# Patient Record
Sex: Female | Born: 1985 | Race: White | Hispanic: No | Smoking: Current every day smoker
Health system: Southern US, Community
[De-identification: ages and names within clinical notes are randomized; demographics above are authoritative.]

## PROBLEM LIST (undated history)

## (undated) ENCOUNTER — Inpatient Hospital Stay: Payer: Self-pay

## (undated) DIAGNOSIS — R569 Unspecified convulsions: Secondary | ICD-10-CM

---
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
## 2006-04-05 ENCOUNTER — Emergency Department: Payer: Self-pay | Admitting: Emergency Medicine

## 2006-05-08 ENCOUNTER — Emergency Department: Payer: Self-pay | Admitting: Emergency Medicine

## 2006-05-10 ENCOUNTER — Ambulatory Visit: Payer: Self-pay | Admitting: Obstetrics & Gynecology

## 2006-05-19 ENCOUNTER — Emergency Department: Payer: Self-pay | Admitting: Emergency Medicine

## 2006-08-12 ENCOUNTER — Emergency Department: Payer: Self-pay | Admitting: Internal Medicine

## 2006-08-28 ENCOUNTER — Observation Stay: Payer: Self-pay

## 2007-01-07 ENCOUNTER — Observation Stay: Payer: Self-pay | Admitting: Obstetrics and Gynecology

## 2007-02-26 ENCOUNTER — Emergency Department: Payer: Self-pay | Admitting: Emergency Medicine

## 2007-05-12 ENCOUNTER — Emergency Department: Payer: Self-pay | Admitting: Emergency Medicine

## 2007-07-22 ENCOUNTER — Other Ambulatory Visit: Payer: Self-pay

## 2007-07-22 ENCOUNTER — Emergency Department: Payer: Self-pay | Admitting: Emergency Medicine

## 2007-08-13 ENCOUNTER — Emergency Department: Payer: Self-pay | Admitting: Emergency Medicine

## 2008-02-22 ENCOUNTER — Emergency Department: Payer: Self-pay | Admitting: Emergency Medicine

## 2008-06-06 ENCOUNTER — Emergency Department: Payer: Self-pay | Admitting: Emergency Medicine

## 2011-10-13 ENCOUNTER — Emergency Department: Payer: Self-pay | Admitting: Emergency Medicine

## 2011-10-26 ENCOUNTER — Emergency Department: Payer: Self-pay | Admitting: Emergency Medicine

## 2012-08-30 ENCOUNTER — Encounter (HOSPITAL_COMMUNITY): Payer: Self-pay | Admitting: *Deleted

## 2012-08-30 ENCOUNTER — Emergency Department (HOSPITAL_COMMUNITY)
Admission: EM | Admit: 2012-08-30 | Discharge: 2012-08-30 | Disposition: A | Discharge status: Home / Self Care | Payer: Medicaid Other | Attending: Emergency Medicine | Admitting: Emergency Medicine

## 2012-08-30 DIAGNOSIS — Z331 Pregnant state, incidental: Secondary | ICD-10-CM | POA: Insufficient documentation

## 2012-08-30 DIAGNOSIS — F17200 Nicotine dependence, unspecified, uncomplicated: Secondary | ICD-10-CM | POA: Insufficient documentation

## 2012-08-30 DIAGNOSIS — R112 Nausea with vomiting, unspecified: Secondary | ICD-10-CM | POA: Insufficient documentation

## 2012-08-30 DIAGNOSIS — Z8669 Personal history of other diseases of the nervous system and sense organs: Secondary | ICD-10-CM | POA: Insufficient documentation

## 2012-08-30 DIAGNOSIS — N949 Unspecified condition associated with female genital organs and menstrual cycle: Secondary | ICD-10-CM | POA: Insufficient documentation

## 2012-08-30 DIAGNOSIS — Z3490 Encounter for supervision of normal pregnancy, unspecified, unspecified trimester: Secondary | ICD-10-CM

## 2012-08-30 HISTORY — DX: Unspecified convulsions: R56.9

## 2012-08-30 LAB — CBC WITH DIFFERENTIAL/PLATELET
Eosinophils Relative: 1 % (ref 0–5)
HCT: 38.7 % (ref 36.0–46.0)
Hemoglobin: 13.3 g/dL (ref 12.0–15.0)
Lymphocytes Relative: 21 % (ref 12–46)
Lymphs Abs: 3.1 10*3/uL (ref 0.7–4.0)
MCV: 87.4 fL (ref 78.0–100.0)
Monocytes Absolute: 0.6 10*3/uL (ref 0.1–1.0)
Monocytes Relative: 4 % (ref 3–12)
Neutro Abs: 10.7 10*3/uL — ABNORMAL HIGH (ref 1.7–7.7)
RDW: 13.3 % (ref 11.5–15.5)
WBC: 14.5 10*3/uL — ABNORMAL HIGH (ref 4.0–10.5)

## 2012-08-30 LAB — WET PREP, GENITAL
Trich, Wet Prep: NONE SEEN
Yeast Wet Prep HPF POC: NONE SEEN

## 2012-08-30 LAB — URINALYSIS, ROUTINE W REFLEX MICROSCOPIC
Bilirubin Urine: NEGATIVE
Glucose, UA: NEGATIVE mg/dL
Hgb urine dipstick: NEGATIVE
Specific Gravity, Urine: 1.01 (ref 1.005–1.030)
Urobilinogen, UA: 0.2 mg/dL (ref 0.0–1.0)

## 2012-08-30 LAB — PREGNANCY, URINE: Preg Test, Ur: POSITIVE — AB

## 2012-08-30 NOTE — ED Provider Notes (Signed)
History     CSN: [id number]  Arrival date & time 08/30/12  1610   First MD Initiated Contact with Patient 08/30/12 1749      Chief Complaint  Patient presents with  . Abdominal Pain    (Consider location/radiation/quality/duration/timing/severity/associated sxs/prior treatment) HPI Comments: Patient presents with suprapubic discomfort that started this morning.  She reports nausea and vomiting yesterday.  She had a light period last month and is late this month.  Two home pregnancy tests positive at home.  She is sexually active with one partner not using protection.    Patient is a 27 y.o. female presenting with abdominal pain. The history is provided by the patient.  Abdominal Pain Pain location:  Suprapubic Pain quality: cramping   Pain radiates to:  Does not radiate Pain severity:  Mild Onset quality:  Sudden Duration: started this morning. Timing:  Intermittent Progression:  Worsening Chronicity:  New Relieved by:  Nothing Worsened by:  Nothing tried Ineffective treatments:  None tried   Past Medical History  Diagnosis Date  . Seizures     Past Surgical History  Procedure Laterality Date  . Cesarean section      History reviewed. No pertinent family history.  History  Substance Use Topics  . Smoking status: Current Every Day Smoker  . Smokeless tobacco: Not on file  . Alcohol Use: Yes    OB History   Grav Para Term Preterm Abortions TAB SAB Ect Mult Living                  Review of Systems  Gastrointestinal: Positive for abdominal pain.  All other systems reviewed and are negative.    Allergies  Review of patient's allergies indicates no known allergies.  Home Medications   Current Outpatient Rx  Name  Route  Sig  Dispense  Refill  . Multiple Vitamins-Minerals (HAIR/SKIN/NAILS PO)   Oral   Take 1 tablet by mouth daily.         Marland Kitchen oxyCODONE-acetaminophen (PERCOCET/ROXICET) 5-325 MG per tablet   Oral   Take 1 tablet by mouth every 4  (four) hours as needed for pain.           BP 141/91  Pulse 114  Temp(Src) 98.3 F (36.8 C) (Oral)  Resp 20  Ht 5\' 3"  (1.6 m)  Wt 160 lb (72.576 kg)  BMI 28.35 kg/m2  SpO2 99%  LMP 07/10/2012  Physical Exam  Nursing note and vitals reviewed. Constitutional: She is oriented to person, place, and time. She appears well-developed and well-nourished. No distress.  HENT:  Head: Normocephalic and atraumatic.  Neck: Normal range of motion. Neck supple.  Cardiovascular: Normal rate and regular rhythm.  Exam reveals no gallop and no friction rub.   No murmur heard. Pulmonary/Chest: Effort normal and breath sounds normal. No respiratory distress. She has no wheezes.  Abdominal: Soft. Bowel sounds are normal. She exhibits no distension. There is no tenderness.  Musculoskeletal: Normal range of motion.  Neurological: She is alert and oriented to person, place, and time.  Skin: Skin is warm and dry. She is not diaphoretic.    ED Course  Procedures (including critical care time)  Labs Reviewed  URINALYSIS, ROUTINE W REFLEX MICROSCOPIC  PREGNANCY, URINE   No results found.   No diagnosis found.    MDM  The patient presents here with lower abdominal cramping, vomiting since yesterday.  She had two positive pregnancy tests at home.  Her urine preg is positive and this was  followed up with a quantitative that was 38K.  She has not had any bleeding and the pelvic exam is essentially unremarkable.  The wet prep is not diagnostic of yeast infection or BV.  I doubt this is an ectopic pregnancy, however I feel as though an ultrasound in the next 12-24 hours would be appropriate as she appears quite stable hemodynamically.  The Hb is 13.3 and she appears in no discomfort.          Geoffery Lyons, MD 08/30/12 2025

## 2012-08-30 NOTE — ED Notes (Signed)
abd pain , onset this am.  Vomiting yesterday.  Pos home preg test last pm.

## 2012-08-31 ENCOUNTER — Other Ambulatory Visit (HOSPITAL_COMMUNITY): Payer: Medicaid Other

## 2012-09-01 LAB — GC/CHLAMYDIA PROBE AMP
CT Probe RNA: NEGATIVE
GC Probe RNA: NEGATIVE

## 2012-09-03 ENCOUNTER — Other Ambulatory Visit: Payer: Self-pay | Admitting: Neurology

## 2012-09-03 ENCOUNTER — Other Ambulatory Visit (HOSPITAL_COMMUNITY): Payer: Self-pay | Admitting: Emergency Medicine

## 2012-09-03 ENCOUNTER — Ambulatory Visit (HOSPITAL_COMMUNITY)
Admission: RE | Admit: 2012-09-03 | Discharge: 2012-09-03 | Disposition: A | Discharge status: Home / Self Care | Payer: Medicaid Other | Source: Ambulatory Visit | Attending: Emergency Medicine | Admitting: Emergency Medicine

## 2012-09-03 DIAGNOSIS — Z36 Encounter for antenatal screening of mother: Secondary | ICD-10-CM | POA: Insufficient documentation

## 2012-09-15 ENCOUNTER — Other Ambulatory Visit: Payer: Self-pay | Admitting: Obstetrics & Gynecology

## 2012-09-15 DIAGNOSIS — O3680X0 Pregnancy with inconclusive fetal viability, not applicable or unspecified: Secondary | ICD-10-CM

## 2012-09-17 ENCOUNTER — Other Ambulatory Visit: Payer: Self-pay | Admitting: Obstetrics & Gynecology

## 2012-09-17 ENCOUNTER — Ambulatory Visit (INDEPENDENT_AMBULATORY_CARE_PROVIDER_SITE_OTHER): Payer: Medicaid Other

## 2012-09-17 DIAGNOSIS — O26849 Uterine size-date discrepancy, unspecified trimester: Secondary | ICD-10-CM

## 2012-09-17 DIAGNOSIS — O3680X0 Pregnancy with inconclusive fetal viability, not applicable or unspecified: Secondary | ICD-10-CM

## 2012-09-17 NOTE — Progress Notes (Signed)
U/S-single IUP +FCA noted, FHR=164 BPM, cx long and closed, bilateral adnexa wnl, C.L. Noted on Lt (1.8cm), CRl c/w 9+1wks EDD 04/21/2013

## 2012-09-19 LAB — US OB COMP LESS 14 WKS

## 2012-09-22 ENCOUNTER — Encounter: Payer: Medicaid Other | Admitting: Advanced Practice Midwife

## 2012-10-11 ENCOUNTER — Encounter: Payer: Medicaid Other | Admitting: Women's Health

## 2012-10-11 ENCOUNTER — Encounter: Payer: Self-pay | Admitting: *Deleted

## 2012-10-14 ENCOUNTER — Encounter: Payer: Medicaid Other | Admitting: Advanced Practice Midwife

## 2012-10-25 ENCOUNTER — Other Ambulatory Visit: Payer: Self-pay | Admitting: Women's Health

## 2012-10-25 ENCOUNTER — Ambulatory Visit (INDEPENDENT_AMBULATORY_CARE_PROVIDER_SITE_OTHER): Payer: Medicaid Other | Admitting: Women's Health

## 2012-10-25 ENCOUNTER — Other Ambulatory Visit (HOSPITAL_COMMUNITY)
Admission: RE | Admit: 2012-10-25 | Discharge: 2012-10-25 | Disposition: A | Discharge status: Home / Self Care | Payer: Medicaid Other | Source: Ambulatory Visit | Attending: Obstetrics & Gynecology | Admitting: Obstetrics & Gynecology

## 2012-10-25 ENCOUNTER — Encounter: Payer: Self-pay | Admitting: Women's Health

## 2012-10-25 VITALS — BP 140/90 | Wt 179.8 lb

## 2012-10-25 DIAGNOSIS — Z113 Encounter for screening for infections with a predominantly sexual mode of transmission: Secondary | ICD-10-CM | POA: Insufficient documentation

## 2012-10-25 DIAGNOSIS — Z3482 Encounter for supervision of other normal pregnancy, second trimester: Secondary | ICD-10-CM

## 2012-10-25 DIAGNOSIS — O34219 Maternal care for unspecified type scar from previous cesarean delivery: Secondary | ICD-10-CM

## 2012-10-25 DIAGNOSIS — O2342 Unspecified infection of urinary tract in pregnancy, second trimester: Secondary | ICD-10-CM

## 2012-10-25 DIAGNOSIS — Z3480 Encounter for supervision of other normal pregnancy, unspecified trimester: Secondary | ICD-10-CM | POA: Insufficient documentation

## 2012-10-25 DIAGNOSIS — Z1389 Encounter for screening for other disorder: Secondary | ICD-10-CM

## 2012-10-25 DIAGNOSIS — Z331 Pregnant state, incidental: Secondary | ICD-10-CM

## 2012-10-25 DIAGNOSIS — O2390 Unspecified genitourinary tract infection in pregnancy, unspecified trimester: Secondary | ICD-10-CM

## 2012-10-25 DIAGNOSIS — Z3481 Encounter for supervision of other normal pregnancy, first trimester: Secondary | ICD-10-CM

## 2012-10-25 DIAGNOSIS — Z01419 Encounter for gynecological examination (general) (routine) without abnormal findings: Secondary | ICD-10-CM | POA: Insufficient documentation

## 2012-10-25 DIAGNOSIS — Z98891 History of uterine scar from previous surgery: Secondary | ICD-10-CM | POA: Insufficient documentation

## 2012-10-25 LAB — POCT URINALYSIS DIPSTICK
Ketones, UA: NEGATIVE
Leukocytes, UA: NEGATIVE
Protein, UA: NEGATIVE

## 2012-10-25 MED ORDER — PRENATAL PLUS 27-1 MG PO TABS: 1.0000 | ORAL_TABLET | Freq: Every day | ORAL | Status: DC

## 2012-10-25 MED ORDER — NITROFURANTOIN MONOHYD MACRO 100 MG PO CAPS: 100.0000 mg | ORAL_CAPSULE | Freq: Two times a day (BID) | ORAL | Status: DC

## 2012-10-25 NOTE — Patient Instructions (Addendum)
You will have your sugar test next visit.  Please do not eat or drink anything after midnight the night before you come, not even water.  You will be here for at least two hours.     Pregnancy - Second Trimester The second trimester of pregnancy (3 to 6 months) is a period of rapid growth for you and your baby. At the end of the sixth month, your baby is about 9 inches long and weighs 1 1/2 pounds. You will begin to feel the baby move between 18 and 20 weeks of the pregnancy. This is called quickening. Weight gain is faster. A clear fluid (colostrum) may leak out of your breasts. You may feel small contractions of the womb (uterus). This is known as false labor or Braxton-Hicks contractions. This is like a practice for labor when the baby is ready to be born. Usually, the problems with morning sickness have usually passed by the end of your first trimester. Some women develop small dark blotches (called cholasma, mask of pregnancy) on their face that usually goes away after the baby is born. Exposure to the sun makes the blotches worse. Acne may also develop in some pregnant women and pregnant women who have acne, may find that it goes away. PRENATAL EXAMS  Blood work may continue to be done during prenatal exams. These tests are done to check on your health and the probable health of your baby. Blood work is used to follow your blood levels (hemoglobin). Anemia (low hemoglobin) is common during pregnancy. Iron and vitamins are given to help prevent this. You will also be checked for diabetes between 24 and 28 weeks of the pregnancy. Some of the previous blood tests may be repeated.  The size of the uterus is measured during each visit. This is to make sure that the baby is continuing to grow properly according to the dates of the pregnancy.  Your blood pressure is checked every prenatal visit. This is to make sure you are not getting toxemia.  Your urine is checked to make sure you do not have an  infection, diabetes or protein in the urine.  Your weight is checked often to make sure gains are happening at the suggested rate. This is to ensure that both you and your baby are growing normally.  Sometimes, an ultrasound is performed to confirm the proper growth and development of the baby. This is a test which bounces harmless sound waves off the baby so your caregiver can more accurately determine due dates. Sometimes, a specialized test is done on the amniotic fluid surrounding the baby. This test is called an amniocentesis. The amniotic fluid is obtained by sticking a needle into the belly (abdomen). This is done to check the chromosomes in instances where there is a concern about possible genetic problems with the baby. It is also sometimes done near the end of pregnancy if an early delivery is required. In this case, it is done to help make sure the baby's lungs are mature enough for the baby to live outside of the womb. CHANGES OCCURING IN THE SECOND TRIMESTER OF PREGNANCY Your body goes through many changes during pregnancy. They vary from person to person. Talk to your caregiver about changes you notice that you are concerned about.  During the second trimester, you will likely have an increase in your appetite. It is normal to have cravings for certain foods. This varies from person to person and pregnancy to pregnancy.  Your lower abdomen will  begin to bulge.  You may have to urinate more often because the uterus and baby are pressing on your bladder. It is also common to get more bladder infections during pregnancy (pain with urination). You can help this by drinking lots of fluids and emptying your bladder before and after intercourse.  You may begin to get stretch marks on your hips, abdomen, and breasts. These are normal changes in the body during pregnancy. There are no exercises or medications to take that prevent this change.  You may begin to develop swollen and bulging veins  (varicose veins) in your legs. Wearing support hose, elevating your feet for 15 minutes, 3 to 4 times a day and limiting salt in your diet helps lessen the problem.  Heartburn may develop as the uterus grows and pushes up against the stomach. Antacids recommended by your caregiver helps with this problem. Also, eating smaller meals 4 to 5 times a day helps.  Constipation can be treated with a stool softener or adding bulk to your diet. Drinking lots of fluids, vegetables, fruits, and whole grains are helpful.  Exercising is also helpful. If you have been very active up until your pregnancy, most of these activities can be continued during your pregnancy. If you have been less active, it is helpful to start an exercise program such as walking.  Hemorrhoids (varicose veins in the rectum) may develop at the end of the second trimester. Warm sitz baths and hemorrhoid cream recommended by your caregiver helps hemorrhoid problems.  Backaches may develop during this time of your pregnancy. Avoid heavy lifting, wear low heal shoes and practice good posture to help with backache problems.  Some pregnant women develop tingling and numbness of their hand and fingers because of swelling and tightening of ligaments in the wrist (carpel tunnel syndrome). This goes away after the baby is born.  As your breasts enlarge, you may have to get a bigger bra. Get a comfortable, cotton, support bra. Do not get a nursing bra until the last month of the pregnancy if you will be nursing the baby.  You may get a dark line from your belly button to the pubic area called the linea nigra.  You may develop rosy cheeks because of increase blood flow to the face.  You may develop spider looking lines of the face, neck, arms and chest. These go away after the baby is born. HOME CARE INSTRUCTIONS   It is extremely important to avoid all smoking, herbs, alcohol, and unprescribed drugs during your pregnancy. These chemicals  affect the formation and growth of the baby. Avoid these chemicals throughout the pregnancy to ensure the delivery of a healthy infant.  Most of your home care instructions are the same as suggested for the first trimester of your pregnancy. Keep your caregiver's appointments. Follow your caregiver's instructions regarding medication use, exercise and diet.  During pregnancy, you are providing food for you and your baby. Continue to eat regular, well-balanced meals. Choose foods such as meat, fish, milk and other low fat dairy products, vegetables, fruits, and whole-grain breads and cereals. Your caregiver will tell you of the ideal weight gain.  A physical sexual relationship may be continued up until near the end of pregnancy if there are no other problems. Problems could include early (premature) leaking of amniotic fluid from the membranes, vaginal bleeding, abdominal pain, or other medical or pregnancy problems.  Exercise regularly if there are no restrictions. Check with your caregiver if you are unsure of  the safety of some of your exercises. The greatest weight gain will occur in the last 2 trimesters of pregnancy. Exercise will help you:  Control your weight.  Get you in shape for labor and delivery.  Lose weight after you have the baby.  Wear a good support or jogging bra for breast tenderness during pregnancy. This may help if worn during sleep. Pads or tissues may be used in the bra if you are leaking colostrum.  Do not use hot tubs, steam rooms or saunas throughout the pregnancy.  Wear your seat belt at all times when driving. This protects you and your baby if you are in an accident.  Avoid raw meat, uncooked cheese, cat litter boxes and soil used by cats. These carry germs that can cause birth defects in the baby.  The second trimester is also a good time to visit your dentist for your dental health if this has not been done yet. Getting your teeth cleaned is OK. Use a soft  toothbrush. Brush gently during pregnancy.  It is easier to loose urine during pregnancy. Tightening up and strengthening the pelvic muscles will help with this problem. Practice stopping your urination while you are going to the bathroom. These are the same muscles you need to strengthen. It is also the muscles you would use as if you were trying to stop from passing gas. You can practice tightening these muscles up 10 times a set and repeating this about 3 times per day. Once you know what muscles to tighten up, do not perform these exercises during urination. It is more likely to contribute to an infection by backing up the urine.  Ask for help if you have financial, counseling or nutritional needs during pregnancy. Your caregiver will be able to offer counseling for these needs as well as refer you for other special needs.  Your skin may become oily. If so, wash your face with mild soap, use non-greasy moisturizer and oil or cream based makeup. MEDICATIONS AND DRUG USE IN PREGNANCY  Take prenatal vitamins as directed. The vitamin should contain 1 milligram of folic acid. Keep all vitamins out of reach of children. Only a couple vitamins or tablets containing iron may be fatal to a baby or young child when ingested.  Avoid use of all medications, including herbs, over-the-counter medications, not prescribed or suggested by your caregiver. Only take over-the-counter or prescription medicines for pain, discomfort, or fever as directed by your caregiver. Do not use aspirin.  Let your caregiver also know about herbs you may be using.  Alcohol is related to a number of birth defects. This includes fetal alcohol syndrome. All alcohol, in any form, should be avoided completely. Smoking will cause low birth rate and premature babies.  Street or illegal drugs are very harmful to the baby. They are absolutely forbidden. A baby born to an addicted mother will be addicted at birth. The baby will go through  the same withdrawal an adult does. SEEK MEDICAL CARE IF:  You have any concerns or worries during your pregnancy. It is better to call with your questions if you feel they cannot wait, rather than worry about them. SEEK IMMEDIATE MEDICAL CARE IF:   An unexplained oral temperature above 102 F (38.9 C) develops, or as your caregiver suggests.  You have leaking of fluid from the vagina (birth canal). If leaking membranes are suspected, take your temperature and tell your caregiver of this when you call.  There is vaginal spotting, bleeding,  or passing clots. Tell your caregiver of the amount and how many pads are used. Light spotting in pregnancy is common, especially following intercourse.  You develop a bad smelling vaginal discharge with a change in the color from clear to white.  You continue to feel sick to your stomach (nauseated) and have no relief from remedies suggested. You vomit blood or coffee ground-like materials.  You lose more than 2 pounds of weight or gain more than 2 pounds of weight over 1 week, or as suggested by your caregiver.  You notice swelling of your face, hands, feet, or legs.  You get exposed to [place] measles and have never had them.  You are exposed to fifth disease or chickenpox.  You develop belly (abdominal) pain. Round ligament discomfort is a common non-cancerous (benign) cause of abdominal pain in pregnancy. Your caregiver still must evaluate you.  You develop a bad headache that does not go away.  You develop fever, diarrhea, pain with urination, or shortness of breath.  You develop visual problems, blurry, or double vision.  You fall or are in a car accident or any kind of trauma.  There is mental or physical violence at home. Document Released: 06/03/2001 Document Revised: 09/01/2011 Document Reviewed: 12/06/2008 [facility] Patient Information 2013 [place], [place].

## 2012-10-25 NOTE — Progress Notes (Signed)
  Subjective:    NATAKI MCCRUMB is a 27 y.o. G61P2002 female at [redacted]w[redacted]d by 9wk u/s, being seen today for her first obstetrical visit.  Her obstetrical history is significant for obesity, smoker and h/o previous c/s x 2, as well as A1DM x 2.  1st c/s for arrest of descent in 2nd stage, unsure of how long she pushed.  2nd c/s was repeat.  Interested in VBAC.  Pregnancy history fully reviewed. Reports h/o seizures w/ last one being 8-9 yrs ago, not currently on meds. Denies h/o HTN.    Patient denies n/v, cramping, vb, lof, urinary frequency, hesitancy, urgency, or dysuria.   Not currently taking pnv, has been taking otc gummy vitamins, wants pnv rx.  Filed Vitals:   10/25/12 1352  BP: 140/90  Weight: 179 lb 12.8 oz (81.557 kg)    HISTORY: OB History   Grav Para Term Preterm Abortions TAB SAB Ect Mult Living   3 2 2       2      # Outc Date GA Lbr Len/2nd Wgt Sex Del Anes PTL Lv   1 TRM 8/06 [redacted]w[redacted]d  8lb4oz(3.742kg) M LTCS EPI  Yes   2 TRM 7/08 [redacted]w[redacted]d  7lb6oz(3.345kg) F LTCS EPI  Yes   3 CUR              Past Medical History  Diagnosis Date  . Seizures    Past Surgical History  Procedure Laterality Date  . Cesarean section     Family History  Problem Relation Age of Onset  . Cancer Maternal Grandmother     breast  . Cancer Other     breast; maternal grandma's sister.     Exam       Pelvic Exam:    Perineum: Normal Perineum   Vulva: normal   Vagina:  normal mucosa, normal discharge, no palpable nodules   Uterus    normal size/shape for 14wks     Cervix: normal   Adnexa: Not palpable   Urinary:  urethral meatus normal    System: Breast:  deferred   Skin: normal coloration and turgor, no rashes    Neurologic: oriented, normal, normal mood   Extremities: normal strength, tone, and muscle mass   HEENT PERRLA   Mouth/Teeth mucous membranes moist, pharynx normal without lesions   Neck supple and no masses   Cardiovascular: regular rate and rhythm   Respiratory:  appears  well, vitals normal, no respiratory distress, acyanotic, normal RR   Abdomen: soft, non-tender; bowel sounds normal; no masses,  no organomegaly       Thin prep pap obtained today FHT 152 via doppler   Assessment:    Pregnancy: [id number] Patient Active Problem List   Diagnosis Date Noted  . Supervision of other normal pregnancy 10/25/2012    Priority: High  . Previous cesarean section 10/25/2012    Priority: High   [redacted]w[redacted]d SIUP G3P2002 H/O prev c/s x 2, interested in VBAC Asymptomatic bacteruria H/O A1DM x 2 Elevated bp today    Plan:    Initial labs drawn. Rx Prenatal plus Rx Macrobid 100mg  po bid x 7d Problem list reviewed and updated. Genetic Screening discussed AFP: requested. Ultrasound discussed; fetal survey: requested. Discussed warning s/s to report VBAC consent given and discussed- will take home to review and bring back Follow up in 2 days for early 2hr gtt and bp recheck.  Marge Duncans 10/25/2012

## 2012-10-25 NOTE — Progress Notes (Signed)
occ pain under left breast. New OB packet given. Consents signed.

## 2012-10-26 ENCOUNTER — Encounter: Payer: Self-pay | Admitting: Women's Health

## 2012-10-26 DIAGNOSIS — O26899 Other specified pregnancy related conditions, unspecified trimester: Secondary | ICD-10-CM

## 2012-10-26 DIAGNOSIS — Z6791 Unspecified blood type, Rh negative: Secondary | ICD-10-CM | POA: Insufficient documentation

## 2012-10-26 LAB — AFP, QUAD SCREEN
HCG, Total: 11062 m[IU]/mL
Interpretation-AFP: NEGATIVE
MoM for AFP: 0.96
MoM for hCG: 0.38
Open Spina bifida: NEGATIVE
Tri 18 Scr Risk Est: NEGATIVE
uE3 Mom: 0.76
uE3 Value: 0.2 ng/mL

## 2012-10-26 LAB — CBC
HCT: 38 % (ref 36.0–46.0)
Hemoglobin: 13.1 g/dL (ref 12.0–15.0)
MCV: 84.3 fL (ref 78.0–100.0)
WBC: 17.8 10*3/uL — ABNORMAL HIGH (ref 4.0–10.5)

## 2012-10-26 LAB — DRUG SCREEN, URINE, NO CONFIRMATION
Cocaine Metabolites: NEGATIVE
Creatinine,U: 130.3 mg/dL
Phencyclidine (PCP): NEGATIVE

## 2012-10-26 LAB — URINALYSIS
Glucose, UA: NEGATIVE mg/dL
Ketones, ur: NEGATIVE mg/dL
Leukocytes, UA: NEGATIVE
Nitrite: NEGATIVE
Specific Gravity, Urine: 1.014 (ref 1.005–1.030)
pH: 6 (ref 5.0–8.0)

## 2012-10-26 LAB — OXYCODONE SCREEN, UA, RFLX CONFIRM: Oxycodone Screen, Ur: NEGATIVE ng/mL

## 2012-10-26 LAB — ABO AND RH

## 2012-10-26 LAB — RPR

## 2012-10-26 LAB — VARICELLA ZOSTER ANTIBODY, IGG: Varicella IgG: 171 Index — ABNORMAL HIGH (ref <135.00)

## 2012-10-27 ENCOUNTER — Encounter: Payer: Medicaid Other | Admitting: Advanced Practice Midwife

## 2012-10-27 ENCOUNTER — Other Ambulatory Visit: Payer: Medicaid Other

## 2012-10-27 LAB — CYSTIC FIBROSIS DIAGNOSTIC STUDY

## 2012-10-27 LAB — URINE CULTURE: Colony Count: >=100000

## 2012-11-01 ENCOUNTER — Encounter: Payer: Self-pay | Admitting: Adult Health

## 2012-11-01 ENCOUNTER — Ambulatory Visit (INDEPENDENT_AMBULATORY_CARE_PROVIDER_SITE_OTHER): Payer: Medicaid Other | Admitting: Adult Health

## 2012-11-01 ENCOUNTER — Other Ambulatory Visit: Payer: Medicaid Other

## 2012-11-01 VITALS — BP 120/72 | Wt 182.0 lb

## 2012-11-01 DIAGNOSIS — O09291 Supervision of pregnancy with other poor reproductive or obstetric history, first trimester: Secondary | ICD-10-CM

## 2012-11-01 DIAGNOSIS — Z331 Pregnant state, incidental: Secondary | ICD-10-CM

## 2012-11-01 DIAGNOSIS — Z1389 Encounter for screening for other disorder: Secondary | ICD-10-CM

## 2012-11-01 DIAGNOSIS — Z3490 Encounter for supervision of normal pregnancy, unspecified, unspecified trimester: Secondary | ICD-10-CM

## 2012-11-01 DIAGNOSIS — O34219 Maternal care for unspecified type scar from previous cesarean delivery: Secondary | ICD-10-CM

## 2012-11-01 LAB — POCT URINALYSIS DIPSTICK
Blood, UA: NEGATIVE
Glucose, UA: NEGATIVE
Nitrite, UA: NEGATIVE
Protein, UA: NEGATIVE

## 2012-11-01 NOTE — Patient Instructions (Addendum)
Watch salt, finish antibiotics and increase water  Return in 4 weeks for [place]

## 2012-11-01 NOTE — Progress Notes (Signed)
Here for early 2 hour gtt, had some swelling in feet this weekend, no bleeding or pains. No swelling today, watch salt.Will see back in 4 weeks for US.pt aware AFP negative. She is taking antibiotic and increase water.

## 2012-11-01 NOTE — Progress Notes (Signed)
Here today for routine visit and 2 hr sugar test. Pt denies any problems at this time.

## 2012-11-02 ENCOUNTER — Telehealth: Payer: Self-pay | Admitting: Adult Health

## 2012-11-02 LAB — GLUCOSE TOLERANCE, 2 HOURS W/ 1HR: Glucose, 1 hour: 139 mg/dL (ref 70–170)

## 2012-11-02 NOTE — Telephone Encounter (Signed)
Left message that 2 hour sugar test was normal

## 2012-11-29 ENCOUNTER — Ambulatory Visit (INDEPENDENT_AMBULATORY_CARE_PROVIDER_SITE_OTHER): Payer: Medicaid Other

## 2012-11-29 ENCOUNTER — Ambulatory Visit (INDEPENDENT_AMBULATORY_CARE_PROVIDER_SITE_OTHER): Payer: Medicaid Other | Admitting: Women's Health

## 2012-11-29 ENCOUNTER — Encounter: Payer: Self-pay | Admitting: Women's Health

## 2012-11-29 ENCOUNTER — Other Ambulatory Visit: Payer: Self-pay | Admitting: Adult Health

## 2012-11-29 VITALS — BP 110/72 | Wt 183.0 lb

## 2012-11-29 DIAGNOSIS — O09292 Supervision of pregnancy with other poor reproductive or obstetric history, second trimester: Secondary | ICD-10-CM

## 2012-11-29 DIAGNOSIS — Z3482 Encounter for supervision of other normal pregnancy, second trimester: Secondary | ICD-10-CM

## 2012-11-29 DIAGNOSIS — O34219 Maternal care for unspecified type scar from previous cesarean delivery: Secondary | ICD-10-CM

## 2012-11-29 DIAGNOSIS — Z3490 Encounter for supervision of normal pregnancy, unspecified, unspecified trimester: Secondary | ICD-10-CM

## 2012-11-29 DIAGNOSIS — O09299 Supervision of pregnancy with other poor reproductive or obstetric history, unspecified trimester: Secondary | ICD-10-CM | POA: Insufficient documentation

## 2012-11-29 DIAGNOSIS — Z1389 Encounter for screening for other disorder: Secondary | ICD-10-CM

## 2012-11-29 DIAGNOSIS — O99810 Abnormal glucose complicating pregnancy: Secondary | ICD-10-CM

## 2012-11-29 NOTE — Progress Notes (Signed)
U/s performed. Anat screen complete,suboptimal images d/t maternal body habitus, meas c/w dates, active, nml fluid, cx 4 cm, post high placenta, bilat adn viewed

## 2012-11-29 NOTE — Patient Instructions (Addendum)
Pregnancy - Second Trimester The second trimester of pregnancy (3 to 6 months) is a period of rapid growth for you and your baby. At the end of the sixth month, your baby is about 9 inches long and weighs 1 1/2 pounds. You will begin to feel the baby move between 18 and 20 weeks of the pregnancy. This is called quickening. Weight gain is faster. A clear fluid (colostrum) may leak out of your breasts. You may feel small contractions of the womb (uterus). This is known as false labor or Braxton-Hicks contractions. This is like a practice for labor when the baby is ready to be born. Usually, the problems with morning sickness have usually passed by the end of your first trimester. Some women develop small dark blotches (called cholasma, mask of pregnancy) on their face that usually goes away after the baby is born. Exposure to the sun makes the blotches worse. Acne may also develop in some pregnant women and pregnant women who have acne, may find that it goes away. PRENATAL EXAMS  Blood work may continue to be done during prenatal exams. These tests are done to check on your health and the probable health of your baby. Blood work is used to follow your blood levels (hemoglobin). Anemia (low hemoglobin) is common during pregnancy. Iron and vitamins are given to help prevent this. You will also be checked for diabetes between 24 and 28 weeks of the pregnancy. Some of the previous blood tests may be repeated.  The size of the uterus is measured during each visit. This is to make sure that the baby is continuing to grow properly according to the dates of the pregnancy.  Your blood pressure is checked every prenatal visit. This is to make sure you are not getting toxemia.  Your urine is checked to make sure you do not have an infection, diabetes or protein in the urine.  Your weight is checked often to make sure gains are happening at the suggested rate. This is to ensure that both you and your baby are  growing normally.  Sometimes, an ultrasound is performed to confirm the proper growth and development of the baby. This is a test which bounces harmless sound waves off the baby so your caregiver can more accurately determine due dates. Sometimes, a test is done on the amniotic fluid surrounding the baby. This test is called an amniocentesis. The amniotic fluid is obtained by sticking a needle into the belly (abdomen). This is done to check the chromosomes in instances where there is a concern about possible genetic problems with the baby. It is also sometimes done near the end of pregnancy if an early delivery is required. In this case, it is done to help make sure the baby's lungs are mature enough for the baby to live outside of the womb. CHANGES OCCURING IN THE SECOND TRIMESTER OF PREGNANCY Your body goes through many changes during pregnancy. They vary from person to person. Talk to your caregiver about changes you notice that you are concerned about.  During the second trimester, you will likely have an increase in your appetite. It is normal to have cravings for certain foods. This varies from person to person and pregnancy to pregnancy.  Your lower abdomen will begin to bulge.  You may have to urinate more often because the uterus and baby are pressing on your bladder. It is also common to get more bladder infections during pregnancy. You can help this by drinking lots of fluids   and emptying your bladder before and after intercourse.  You may begin to get stretch marks on your hips, abdomen, and breasts. These are normal changes in the body during pregnancy. There are no exercises or medicines to take that prevent this change.  You may begin to develop swollen and bulging veins (varicose veins) in your legs. Wearing support hose, elevating your feet for 15 minutes, 3 to 4 times a day and limiting salt in your diet helps lessen the problem.  Heartburn may develop as the uterus grows and  pushes up against the stomach. Antacids recommended by your caregiver helps with this problem. Also, eating smaller meals 4 to 5 times a day helps.  Constipation can be treated with a stool softener or adding bulk to your diet. Drinking lots of fluids, and eating vegetables, fruits, and whole grains are helpful.  Exercising is also helpful. If you have been very active up until your pregnancy, most of these activities can be continued during your pregnancy. If you have been less active, it is helpful to start an exercise program such as walking.  Hemorrhoids may develop at the end of the second trimester. Warm sitz baths and hemorrhoid cream recommended by your caregiver helps hemorrhoid problems.  Backaches may develop during this time of your pregnancy. Avoid heavy lifting, wear low heal shoes, and practice good posture to help with backache problems.  Some pregnant women develop tingling and numbness of their hand and fingers because of swelling and tightening of ligaments in the wrist (carpel tunnel syndrome). This goes away after the baby is born.  As your breasts enlarge, you may have to get a bigger bra. Get a comfortable, cotton, support bra. Do not get a nursing bra until the last month of the pregnancy if you will be nursing the baby.  You may get a dark line from your belly button to the pubic area called the linea nigra.  You may develop rosy cheeks because of increase blood flow to the face.  You may develop spider looking lines of the face, neck, arms, and chest. These go away after the baby is born. HOME CARE INSTRUCTIONS   It is extremely important to avoid all smoking, herbs, alcohol, and unprescribed drugs during your pregnancy. These chemicals affect the formation and growth of the baby. Avoid these chemicals throughout the pregnancy to ensure the delivery of a healthy infant.  Most of your home care instructions are the same as suggested for the first trimester of your  pregnancy. Keep your caregiver's appointments. Follow your caregiver's instructions regarding medicine use, exercise, and diet.  During pregnancy, you are providing food for you and your baby. Continue to eat regular, well-balanced meals. Choose foods such as meat, fish, milk and other low fat dairy products, vegetables, fruits, and whole-grain breads and cereals. Your caregiver will tell you of the ideal weight gain.  A physical sexual relationship may be continued up until near the end of pregnancy if there are no other problems. Problems could include early (premature) leaking of amniotic fluid from the membranes, vaginal bleeding, abdominal pain, or other medical or pregnancy problems.  Exercise regularly if there are no restrictions. Check with your caregiver if you are unsure of the safety of some of your exercises. The greatest weight gain will occur in the last 2 trimesters of pregnancy. Exercise will help you:  Control your weight.  Get you in shape for labor and delivery.  Lose weight after you have the baby.  Wear   a good support or jogging bra for breast tenderness during pregnancy. This may help if worn during sleep. Pads or tissues may be used in the bra if you are leaking colostrum.  Do not use hot tubs, steam rooms or saunas throughout the pregnancy.  Wear your seat belt at all times when driving. This protects you and your baby if you are in an accident.  Avoid raw meat, uncooked cheese, cat litter boxes, and soil used by cats. These carry germs that can cause birth defects in the baby.  The second trimester is also a good time to visit your dentist for your dental health if this has not been done yet. Getting your teeth cleaned is okay. Use a soft toothbrush. Brush gently during pregnancy.  It is easier to leak urine during pregnancy. Tightening up and strengthening the pelvic muscles will help with this problem. Practice stopping your urination while you are going to the  bathroom. These are the same muscles you need to strengthen. It is also the muscles you would use as if you were trying to stop from passing gas. You can practice tightening these muscles up 10 times a set and repeating this about 3 times per day. Once you know what muscles to tighten up, do not perform these exercises during urination. It is more likely to contribute to an infection by backing up the urine.  Ask for help if you have financial, counseling, or nutritional needs during pregnancy. Your caregiver will be able to offer counseling for these needs as well as refer you for other special needs.  Your skin may become oily. If so, wash your face with mild soap, use non-greasy moisturizer and oil or cream based makeup. MEDICINES AND DRUG USE IN PREGNANCY  Take prenatal vitamins as directed. The vitamin should contain 1 milligram of folic acid. Keep all vitamins out of reach of children. Only a couple vitamins or tablets containing iron may be fatal to a baby or young child when ingested.  Avoid use of all medicines, including herbs, over-the-counter medicines, not prescribed or suggested by your caregiver. Only take over-the-counter or prescription medicines for pain, discomfort, or fever as directed by your caregiver. Do not use aspirin.  Let your caregiver also know about herbs you may be using.  Alcohol is related to a number of birth defects. This includes fetal alcohol syndrome. All alcohol, in any form, should be avoided completely. Smoking will cause low birth rate and premature babies.  Street or illegal drugs are very harmful to the baby. They are absolutely forbidden. A baby born to an addicted mother will be addicted at birth. The baby will go through the same withdrawal an adult does. SEEK MEDICAL CARE IF:  You have any concerns or worries during your pregnancy. It is better to call with your questions if you feel they cannot wait, rather than worry about them. SEEK IMMEDIATE  MEDICAL CARE IF:   An unexplained oral temperature above 102 F (38.9 C) develops, or as your caregiver suggests.  You have leaking of fluid from the vagina (birth canal). If leaking membranes are suspected, take your temperature and tell your caregiver of this when you call.  There is vaginal spotting, bleeding, or passing clots. Tell your caregiver of the amount and how many pads are used. Light spotting in pregnancy is common, especially following intercourse.  You develop a bad smelling vaginal discharge with a change in the color from clear to white.  You continue to feel   sick to your stomach (nauseated) and have no relief from remedies suggested. You vomit blood or coffee ground-like materials.  You lose more than 2 pounds of weight or gain more than 2 pounds of weight over 1 week, or as suggested by your caregiver.  You notice swelling of your face, hands, feet, or legs.  You get exposed to German measles and have never had them.  You are exposed to fifth disease or chickenpox.  You develop belly (abdominal) pain. Round ligament discomfort is a common non-cancerous (benign) cause of abdominal pain in pregnancy. Your caregiver still must evaluate you.  You develop a bad headache that does not go away.  You develop fever, diarrhea, pain with urination, or shortness of breath.  You develop visual problems, blurry, or double vision.  You fall or are in a car accident or any kind of trauma.  There is mental or physical violence at home. Document Released: 06/03/2001 Document Revised: 03/03/2012 Document Reviewed: 12/06/2008 ExitCare Patient Information 2014 [employer].  

## 2012-11-29 NOTE — Progress Notes (Signed)
Right hand hurts.

## 2012-11-29 NOTE — Progress Notes (Signed)
Voided right before u/s, didn't know she needed a specimen, unable to void again. Reports good fm. Denies uc's, lof, vb, urinary frequency, urgency, hesitancy, or dysuria.  No complaints.  Reviewed u/s results, early gtt results, and ptl s/s, fm.  Still thinking about VBAC, hasn't decided yet.  All questions answered. F/U in 4wks for visit.

## 2012-12-27 ENCOUNTER — Encounter: Payer: Medicaid Other | Admitting: Women's Health

## 2013-01-06 ENCOUNTER — Encounter: Payer: Self-pay | Admitting: Advanced Practice Midwife

## 2013-01-06 ENCOUNTER — Ambulatory Visit (INDEPENDENT_AMBULATORY_CARE_PROVIDER_SITE_OTHER): Payer: Medicaid Other | Admitting: Advanced Practice Midwife

## 2013-01-06 VITALS — BP 120/66 | Wt 187.0 lb

## 2013-01-06 DIAGNOSIS — Z331 Pregnant state, incidental: Secondary | ICD-10-CM

## 2013-01-06 DIAGNOSIS — Z1389 Encounter for screening for other disorder: Secondary | ICD-10-CM

## 2013-01-06 DIAGNOSIS — O09299 Supervision of pregnancy with other poor reproductive or obstetric history, unspecified trimester: Secondary | ICD-10-CM

## 2013-01-06 DIAGNOSIS — O34219 Maternal care for unspecified type scar from previous cesarean delivery: Secondary | ICD-10-CM

## 2013-01-06 LAB — POCT URINALYSIS DIPSTICK
Blood, UA: NEGATIVE
Ketones, UA: NEGATIVE
Protein, UA: NEGATIVE

## 2013-01-06 NOTE — Progress Notes (Signed)
Pt here today for routine visit. Pt states that she has had one episode of pain during intercourse.   Caregiver at [facility] told pt that "she has dropped and looks like you have about a month to go "  Pt was very upset that she could have a preterm baby.  Pt reassured that none of that was based upon a medical examination by a qualified professional.  No c/o at this time.  Routine questions about pregnancy answered.  F/U in 3 weeks for PN2.

## 2013-01-06 NOTE — Patient Instructions (Addendum)
Nothing to eat or drink after midnight before your next appointment & plan to be here for 2 hours (for your sugar test).  

## 2013-01-27 ENCOUNTER — Other Ambulatory Visit: Payer: Medicaid Other

## 2013-01-31 ENCOUNTER — Other Ambulatory Visit: Payer: Medicaid Other

## 2013-01-31 ENCOUNTER — Encounter: Payer: Medicaid Other | Admitting: Women's Health

## 2013-02-02 ENCOUNTER — Encounter: Payer: Self-pay | Admitting: Advanced Practice Midwife

## 2013-02-02 ENCOUNTER — Ambulatory Visit (INDEPENDENT_AMBULATORY_CARE_PROVIDER_SITE_OTHER): Payer: Medicaid Other | Admitting: Advanced Practice Midwife

## 2013-02-02 ENCOUNTER — Other Ambulatory Visit: Payer: Medicaid Other

## 2013-02-02 ENCOUNTER — Other Ambulatory Visit: Payer: Self-pay | Admitting: Advanced Practice Midwife

## 2013-02-02 VITALS — BP 110/70 | Wt 190.0 lb

## 2013-02-02 DIAGNOSIS — O09292 Supervision of pregnancy with other poor reproductive or obstetric history, second trimester: Secondary | ICD-10-CM

## 2013-02-02 DIAGNOSIS — O360990 Maternal care for other rhesus isoimmunization, unspecified trimester, not applicable or unspecified: Secondary | ICD-10-CM

## 2013-02-02 DIAGNOSIS — Z3482 Encounter for supervision of other normal pregnancy, second trimester: Secondary | ICD-10-CM

## 2013-02-02 DIAGNOSIS — O360121 Maternal care for anti-D [Rh] antibodies, second trimester, fetus 1: Secondary | ICD-10-CM

## 2013-02-02 DIAGNOSIS — Z98891 History of uterine scar from previous surgery: Secondary | ICD-10-CM

## 2013-02-02 DIAGNOSIS — O09299 Supervision of pregnancy with other poor reproductive or obstetric history, unspecified trimester: Secondary | ICD-10-CM

## 2013-02-02 DIAGNOSIS — Z1389 Encounter for screening for other disorder: Secondary | ICD-10-CM

## 2013-02-02 DIAGNOSIS — Z331 Pregnant state, incidental: Secondary | ICD-10-CM

## 2013-02-02 DIAGNOSIS — O99810 Abnormal glucose complicating pregnancy: Secondary | ICD-10-CM

## 2013-02-02 LAB — CBC
HCT: 35.3 % — ABNORMAL LOW (ref 36.0–46.0)
MCH: 30 pg (ref 26.0–34.0)
MCV: 88.3 fL (ref 78.0–100.0)
RDW: 14.3 % (ref 11.5–15.5)
WBC: 28.4 10*3/uL — ABNORMAL HIGH (ref 4.0–10.5)

## 2013-02-02 LAB — POCT URINALYSIS DIPSTICK
Leukocytes, UA: NEGATIVE
Protein, UA: NEGATIVE

## 2013-02-02 NOTE — Patient Instructions (Addendum)
Dr. Earnest Rosier Judkins [address] in [place] [phone]

## 2013-02-02 NOTE — Progress Notes (Signed)
Here for PN2.  C/O more discharge. SSE: normal discharge.  Routine questions about pregnancy answered.  F/U in 4 weeks for LROB.  Missed a lot of appts with complete dental care and got dismissed from practice. Still needs dental work. Dr. Blondell Reveal recommended.

## 2013-02-03 ENCOUNTER — Other Ambulatory Visit: Payer: Self-pay | Admitting: Advanced Practice Midwife

## 2013-02-03 DIAGNOSIS — O24410 Gestational diabetes mellitus in pregnancy, diet controlled: Secondary | ICD-10-CM

## 2013-02-03 LAB — GLUCOSE TOLERANCE, 2 HOURS W/ 1HR
Glucose, 1 hour: 203 mg/dL — ABNORMAL HIGH (ref 70–170)
Glucose, Fasting: 83 mg/dL (ref 70–99)

## 2013-02-03 LAB — DIFFERENTIAL
Basophils Relative: 0 % (ref 0–1)
Eosinophils Relative: 0 % (ref 0–5)
Monocytes Relative: 6 % (ref 3–12)
Neutrophils Relative %: 84 % — ABNORMAL HIGH (ref 43–77)

## 2013-02-03 LAB — HSV 2 ANTIBODY, IGG: HSV 2 Glycoprotein G Ab, IgG: <0.1 IV

## 2013-02-07 ENCOUNTER — Telehealth: Payer: Self-pay | Admitting: Obstetrics and Gynecology

## 2013-02-07 NOTE — Telephone Encounter (Signed)
Gabriela Woodard, [facility] Department, pregnancy care Production designer, theatre/television/film, asking of any documentation of drug and alcohol use. Informed of Negative UDS and no documentationof any alcohol use.

## 2013-02-08 ENCOUNTER — Telehealth: Payer: Self-pay | Admitting: Advanced Practice Midwife

## 2013-02-08 NOTE — Telephone Encounter (Signed)
Left message x 1. JSY 

## 2013-02-09 ENCOUNTER — Telehealth: Payer: Self-pay | Admitting: Obstetrics and Gynecology

## 2013-02-09 ENCOUNTER — Telehealth: Payer: Self-pay | Admitting: Obstetrics & Gynecology

## 2013-02-09 NOTE — Telephone Encounter (Signed)
Pt states getting cavity filled next Wednesday, requesting a note faxed to dentist saying okay.  Letter printed for Dr. Despina Hidden to sign then to be faxed per pt request.

## 2013-02-09 NOTE — Telephone Encounter (Signed)
Pt informed of WBC's 28K and abnormal Glucose results. Pt informed provider will refer to dietician. Pt verbalized understanding.

## 2013-02-09 NOTE — Telephone Encounter (Signed)
Spoke with Magnus Sinning (pt boyfriend). Pt can't see dentist until next week. Pt having a lot of pain. Tylenol not helping. Can you call in pain meds? Thanks!!! [employer]

## 2013-02-10 MED ORDER — ACETAMINOPHEN-CODEINE #3 300-30 MG PO TABS: 1.0000 | ORAL_TABLET | Freq: Four times a day (QID) | ORAL | Status: DC | PRN

## 2013-02-10 NOTE — Telephone Encounter (Signed)
Tylenol#3 #30 faxed.  PT sent message in [place]

## 2013-02-16 ENCOUNTER — Encounter: Payer: Medicaid Other | Attending: Advanced Practice Midwife

## 2013-03-01 ENCOUNTER — Encounter: Payer: Medicaid Other | Admitting: Women's Health

## 2013-03-02 ENCOUNTER — Encounter: Payer: Medicaid Other | Admitting: Advanced Practice Midwife

## 2013-03-03 ENCOUNTER — Ambulatory Visit (INDEPENDENT_AMBULATORY_CARE_PROVIDER_SITE_OTHER): Payer: Medicaid Other | Admitting: Advanced Practice Midwife

## 2013-03-03 ENCOUNTER — Encounter: Payer: Self-pay | Admitting: Advanced Practice Midwife

## 2013-03-03 VITALS — BP 130/78 | Wt 188.0 lb

## 2013-03-03 DIAGNOSIS — O09299 Supervision of pregnancy with other poor reproductive or obstetric history, unspecified trimester: Secondary | ICD-10-CM

## 2013-03-03 DIAGNOSIS — O360990 Maternal care for other rhesus isoimmunization, unspecified trimester, not applicable or unspecified: Secondary | ICD-10-CM

## 2013-03-03 DIAGNOSIS — O99810 Abnormal glucose complicating pregnancy: Secondary | ICD-10-CM

## 2013-03-03 DIAGNOSIS — Z1389 Encounter for screening for other disorder: Secondary | ICD-10-CM

## 2013-03-03 DIAGNOSIS — E119 Type 2 diabetes mellitus without complications: Secondary | ICD-10-CM

## 2013-03-03 DIAGNOSIS — Z331 Pregnant state, incidental: Secondary | ICD-10-CM

## 2013-03-03 LAB — POCT URINALYSIS DIPSTICK
Blood, UA: NEGATIVE
Ketones, UA: NEGATIVE
Leukocytes, UA: NEGATIVE
Protein, UA: NEGATIVE

## 2013-03-03 MED ORDER — RHO D IMMUNE GLOBULIN 1500 UNIT/2ML IJ SOLN
300.0000 ug | Freq: Once | INTRAMUSCULAR | Status: AC
  Administered 2013-03-03: 300 ug via INTRAMUSCULAR

## 2013-03-03 NOTE — Progress Notes (Signed)
Sometimes has pressure.

## 2013-03-03 NOTE — Patient Instructions (Signed)
Diabetes Meal Planning Guide The diabetes meal planning guide is a tool to help you plan your meals and snacks. It is important for people with diabetes to manage their blood glucose (sugar) levels. Choosing the right foods and the right amounts throughout your day will help control your blood glucose. Eating right can even help you improve your blood pressure and reach or maintain a healthy weight. CARBOHYDRATE COUNTING MADE EASY When you eat carbohydrates, they turn to sugar. This raises your blood glucose level. Counting carbohydrates can help you control this level so you feel better. When you plan your meals by counting carbohydrates, you can have more flexibility in what you eat and balance your medicine with your food intake. Carbohydrate counting simply means adding up the total amount of carbohydrate grams in your meals and snacks. Try to eat about the same amount at each meal. Foods with carbohydrates are listed below. Each portion below is 1 carbohydrate serving or 15 grams of carbohydrates. Ask your dietician how many grams of carbohydrates you should eat at each meal or snack. Grains and Starches  1 slice bread.   English muffin or hotdog/hamburger bun.   cup cold cereal (unsweetened).   cup cooked pasta or rice.   cup starchy vegetables (corn, potatoes, peas, beans, winter squash).  1 tortilla (6 inches).   bagel.  1 waffle or pancake (size of a CD).   cup cooked cereal.  4 to 6 small crackers. *Whole grain is recommended. Fruit  1 cup fresh unsweetened berries, melon, papaya, pineapple.  1 small fresh fruit.   banana or mango.   cup fruit juice (4 oz unsweetened).   cup canned fruit in natural juice or water.  2 tbs dried fruit.  12 to 15 grapes or cherries. Milk and Yogurt  1 cup fat-free or 1% milk.  1 cup soy milk.  6 oz light yogurt with sugar-free sweetener.  6 oz low-fat soy yogurt.  6 oz plain yogurt. Vegetables  1 cup raw or  cup  cooked is counted as 0 carbohydrates or a "free" food.  If you eat 3 or more servings at 1 meal, count them as 1 carbohydrate serving. Other Carbohydrates   oz chips or pretzels.   cup ice cream or frozen yogurt.   cup sherbet or sorbet.  2 inch square cake, no frosting.  1 tbs honey, sugar, jam, jelly, or syrup.  2 small cookies.  3 squares of graham crackers.  3 cups popcorn.  6 crackers.  1 cup broth-based soup.  Count 1 cup casserole or other mixed foods as 2 carbohydrate servings.  Foods with less than 20 calories in a serving may be counted as 0 carbohydrates or a "free" food. You may want to purchase a book or computer software that lists the carbohydrate gram counts of different foods. In addition, the nutrition facts panel on the labels of the foods you eat are a good source of this information. The label will tell you how big the serving size is and the total number of carbohydrate grams you will be eating per serving. Divide this number by 15 to obtain the number of carbohydrate servings in a portion. Remember, 1 carbohydrate serving equals 15 grams of carbohydrate. SERVING SIZES Measuring foods and serving sizes helps you make sure you are getting the right amount of food. The list below tells how big or small some common serving sizes are.  1 oz.........4 stacked dice.  3 oz.........Deck of cards.  1 tsp........Tip   of little finger.  1 tbs......Marland KitchenMarland KitchenThumb.  2 tbs.......Marland KitchenGolf ball.   cup......Marland KitchenHalf of a fist.  1 cup.......Marland KitchenA fist. SAMPLE DIABETES MEAL PLAN Below is a sample meal plan that includes foods from the grain and starches, dairy, vegetable, fruit, and meat groups. A dietician can individualize a meal plan to fit your calorie needs and tell you the number of servings needed from each food group. However, controlling the total amount of carbohydrates in your meal or snack is more important than making sure you include all of the food groups at every  meal. You may interchange carbohydrate containing foods (dairy, starches, and fruits). The meal plan below is an example of a 2000 calorie diet using carbohydrate counting. This meal plan has 17 carbohydrate servings. Breakfast  1 cup oatmeal (2 carb servings).   cup light yogurt (1 carb serving).  1 cup blueberries (1 carb serving).   cup almonds. Snack  1 large apple (2 carb servings).  1 low-fat string cheese stick. Lunch  Chicken breast salad.  1 cup spinach.   cup chopped tomatoes.  2 oz chicken breast, sliced.  2 tbs low-fat [place] dressing.  12 whole-wheat crackers (2 carb servings).  12 to 15 grapes (1 carb serving).  1 cup low-fat milk (1 carb serving). Snack  1 cup carrots.   cup hummus (1 carb serving). Dinner  3 oz broiled salmon.  1 cup brown rice (3 carb servings). Snack  1  cups steamed broccoli (1 carb serving) drizzled with 1 tsp olive oil and lemon juice.  1 cup light pudding (2 carb servings). DIABETES MEAL PLANNING WORKSHEET Your dietician can use this worksheet to help you decide how many servings of foods and what types of foods are right for you.  BREAKFAST Food Group and Servings / Carb Servings Grain/Starches __________________________________ Dairy __________________________________________ Vegetable ______________________________________ Fruit ___________________________________________ Meat __________________________________________ Fat ____________________________________________ LUNCH Food Group and Servings / Carb Servings Grain/Starches ___________________________________ Dairy ___________________________________________ Fruit ____________________________________________ Meat ___________________________________________ Fat _____________________________________________ Laural [place] Food Group and Servings / Carb Servings Grain/Starches ___________________________________ Dairy  ___________________________________________ Fruit ____________________________________________ Meat ___________________________________________ Fat _____________________________________________ SNACKS Food Group and Servings / Carb Servings Grain/Starches ___________________________________ Dairy ___________________________________________ Vegetable _______________________________________ Fruit ____________________________________________ Meat ___________________________________________ Fat _____________________________________________ DAILY TOTALS Starches _________________________ Vegetable ________________________ Fruit ____________________________ Dairy ____________________________ Meat ____________________________ Fat ______________________________ Document Released: 03/06/2005 Document Revised: 09/01/2011 Document Reviewed: 01/15/2009 Exit[employer] Patient Information 2014 [employer]. CHeck blood sugar before you eat or drink:  It should be less than 90  Check blood sugar 2 hours after each meal:  It should be less than 120

## 2013-03-03 NOTE — Progress Notes (Signed)
Missed nutrition appt.  Glucometer rx sent to pharmacy and parameters given to pt.  TO call Monday if BS are elevated.    No c/o at this time.  Routine questions about pregnancy answered.  F/U in 2 weeks for LROB

## 2013-03-07 ENCOUNTER — Telehealth: Payer: Self-pay | Admitting: Obstetrics and Gynecology

## 2013-03-07 ENCOUNTER — Other Ambulatory Visit: Payer: Self-pay | Admitting: Advanced Practice Midwife

## 2013-03-07 NOTE — Telephone Encounter (Signed)
RX for Glucometer, lancets, and strips called to [facility] pharmacy per Cyril Mourning, NP.

## 2013-03-17 ENCOUNTER — Encounter: Payer: Self-pay | Admitting: Obstetrics & Gynecology

## 2013-03-17 ENCOUNTER — Ambulatory Visit (INDEPENDENT_AMBULATORY_CARE_PROVIDER_SITE_OTHER): Payer: Medicaid Other | Admitting: Obstetrics & Gynecology

## 2013-03-17 VITALS — BP 110/60 | Wt 186.0 lb

## 2013-03-17 DIAGNOSIS — D72829 Elevated white blood cell count, unspecified: Secondary | ICD-10-CM | POA: Insufficient documentation

## 2013-03-17 DIAGNOSIS — O99810 Abnormal glucose complicating pregnancy: Secondary | ICD-10-CM

## 2013-03-17 DIAGNOSIS — O34219 Maternal care for unspecified type scar from previous cesarean delivery: Secondary | ICD-10-CM

## 2013-03-17 DIAGNOSIS — O2390 Unspecified genitourinary tract infection in pregnancy, unspecified trimester: Secondary | ICD-10-CM

## 2013-03-17 DIAGNOSIS — O360990 Maternal care for other rhesus isoimmunization, unspecified trimester, not applicable or unspecified: Secondary | ICD-10-CM

## 2013-03-17 MED ORDER — INFLUENZA VAC SPLIT QUAD 0.5 ML IM SUSP
0.5000 mL | Freq: Once | INTRAMUSCULAR | Status: AC
  Administered 2013-03-17: 0.5 mL via INTRAMUSCULAR

## 2013-03-17 MED ORDER — INFLUENZA VAC SPLIT QUAD 0.5 ML IM SUSP: 0.5000 mL | INTRAMUSCULAR | Status: AC

## 2013-03-17 NOTE — Progress Notes (Signed)
BS are a bit borderline will hold off on meds for now. BP weight and urine results all reviewed and noted. Patient reports good fetal movement, denies any bleeding and no rupture of membranes symptoms or regular contractions. Patient is without complaints. All questions were answered.

## 2013-03-18 LAB — CBC WITH DIFFERENTIAL/PLATELET
Eosinophils Absolute: 0.1 10*3/uL (ref 0.0–0.7)
Eosinophils Relative: 0 % (ref 0–5)
HCT: 36.8 % (ref 36.0–46.0)
Hemoglobin: 12.7 g/dL (ref 12.0–15.0)
Lymphocytes Relative: 18 % (ref 12–46)
Lymphs Abs: 5.1 10*3/uL — ABNORMAL HIGH (ref 0.7–4.0)
MCH: 30.2 pg (ref 26.0–34.0)
MCV: 87.4 fL (ref 78.0–100.0)
Monocytes Absolute: 1.4 10*3/uL — ABNORMAL HIGH (ref 0.1–1.0)
Monocytes Relative: 5 % (ref 3–12)
RBC: 4.21 MIL/uL (ref 3.87–5.11)
WBC: 28.5 10*3/uL — ABNORMAL HIGH (ref 4.0–10.5)

## 2013-03-24 ENCOUNTER — Ambulatory Visit (INDEPENDENT_AMBULATORY_CARE_PROVIDER_SITE_OTHER): Payer: Medicaid Other | Admitting: Obstetrics and Gynecology

## 2013-03-24 VITALS — BP 120/70 | Wt 184.6 lb

## 2013-03-24 DIAGNOSIS — Z331 Pregnant state, incidental: Secondary | ICD-10-CM

## 2013-03-24 DIAGNOSIS — O24410 Gestational diabetes mellitus in pregnancy, diet controlled: Secondary | ICD-10-CM

## 2013-03-24 DIAGNOSIS — Z1389 Encounter for screening for other disorder: Secondary | ICD-10-CM

## 2013-03-24 DIAGNOSIS — O34219 Maternal care for unspecified type scar from previous cesarean delivery: Secondary | ICD-10-CM

## 2013-03-24 DIAGNOSIS — O360990 Maternal care for other rhesus isoimmunization, unspecified trimester, not applicable or unspecified: Secondary | ICD-10-CM

## 2013-03-24 DIAGNOSIS — O99810 Abnormal glucose complicating pregnancy: Secondary | ICD-10-CM

## 2013-03-24 LAB — POCT URINALYSIS DIPSTICK
Blood, UA: NEGATIVE
Nitrite, UA: NEGATIVE

## 2013-03-24 NOTE — Progress Notes (Signed)
No complaints at this time.

## 2013-03-24 NOTE — Progress Notes (Signed)
PT with leukocytosis, wbc 28 K both times it has been checked, no fever, Will repeat around 38 wk DM A-1   Excellent cbg's fastings 60-80,, 2 hr pc all <120 Will check u/s for efw at 38wk REpeat CEsarean desired would need on 23rd or 24th Oct, will discuss with Dr Despina Hidden.

## 2013-03-30 ENCOUNTER — Other Ambulatory Visit: Payer: Self-pay | Admitting: Obstetrics and Gynecology

## 2013-03-30 ENCOUNTER — Encounter: Payer: Self-pay | Admitting: Obstetrics and Gynecology

## 2013-03-30 ENCOUNTER — Ambulatory Visit (INDEPENDENT_AMBULATORY_CARE_PROVIDER_SITE_OTHER): Payer: Medicaid Other

## 2013-03-30 ENCOUNTER — Ambulatory Visit (INDEPENDENT_AMBULATORY_CARE_PROVIDER_SITE_OTHER): Payer: Medicaid Other | Admitting: Obstetrics and Gynecology

## 2013-03-30 VITALS — BP 120/72 | Wt 185.0 lb

## 2013-03-30 DIAGNOSIS — Z331 Pregnant state, incidental: Secondary | ICD-10-CM

## 2013-03-30 DIAGNOSIS — O34219 Maternal care for unspecified type scar from previous cesarean delivery: Secondary | ICD-10-CM

## 2013-03-30 DIAGNOSIS — O360990 Maternal care for other rhesus isoimmunization, unspecified trimester, not applicable or unspecified: Secondary | ICD-10-CM

## 2013-03-30 DIAGNOSIS — O99810 Abnormal glucose complicating pregnancy: Secondary | ICD-10-CM

## 2013-03-30 DIAGNOSIS — O24410 Gestational diabetes mellitus in pregnancy, diet controlled: Secondary | ICD-10-CM

## 2013-03-30 DIAGNOSIS — Z1389 Encounter for screening for other disorder: Secondary | ICD-10-CM

## 2013-03-30 DIAGNOSIS — Z3483 Encounter for supervision of other normal pregnancy, third trimester: Secondary | ICD-10-CM

## 2013-03-30 LAB — POCT URINALYSIS DIPSTICK
Blood, UA: NEGATIVE
Glucose, UA: NEGATIVE
Nitrite, UA: NEGATIVE

## 2013-03-30 NOTE — Progress Notes (Signed)
U/S(36+6wks)-vtx active fetus, BPP 8/8, fluid wnl AFI-7.4cm, post gr 2 plac, EFW 5 lb 8 oz (13th%tile), female fetus "Lonni Fix"

## 2013-03-30 NOTE — Progress Notes (Signed)
Repeat c/s 23 Oct 7:30 or 915  Future BC nexplanon.

## 2013-03-30 NOTE — Progress Notes (Signed)
Pt here today for routine visit and [place]. Pt also needs GBS GC/CHL. Pt states that she is having some pressure more at night than during the day. Pt denies any problems or concerns at this time.

## 2013-03-30 NOTE — Patient Instructions (Signed)
[facility] oct [facility]

## 2013-03-31 LAB — GC/CHLAMYDIA PROBE AMP
CT Probe RNA: NEGATIVE
GC Probe RNA: NEGATIVE

## 2013-04-05 ENCOUNTER — Encounter (HOSPITAL_COMMUNITY): Payer: Self-pay

## 2013-04-07 ENCOUNTER — Encounter: Payer: Self-pay | Admitting: Obstetrics & Gynecology

## 2013-04-08 ENCOUNTER — Encounter: Payer: Self-pay | Admitting: Obstetrics and Gynecology

## 2013-04-09 ENCOUNTER — Inpatient Hospital Stay: Payer: Self-pay

## 2013-04-09 LAB — DRUG SCREEN, URINE
Amphetamines, Ur Screen: NEGATIVE (ref ?–1000)
Barbiturates, Ur Screen: NEGATIVE (ref ?–200)
Benzodiazepine, Ur Scrn: NEGATIVE (ref ?–200)
Cannabinoid 50 Ng, Ur ~~LOC~~: NEGATIVE (ref ?–50)
Cocaine Metabolite,Ur ~~LOC~~: NEGATIVE (ref ?–300)
MDMA (Ecstasy)Ur Screen: NEGATIVE (ref ?–500)
Opiate, Ur Screen: NEGATIVE (ref ?–300)
Phencyclidine (PCP) Ur S: NEGATIVE (ref ?–25)

## 2013-04-09 LAB — CBC WITH DIFFERENTIAL/PLATELET
Basophil %: 0.6 %
Eosinophil #: 0.2 10*3/uL (ref 0.0–0.7)
HCT: 36 % (ref 35.0–47.0)
HGB: 12.5 g/dL (ref 12.0–16.0)
Lymphocyte #: 4.8 10*3/uL — ABNORMAL HIGH (ref 1.0–3.6)
MCV: 87 fL (ref 80–100)
Monocyte #: 1.6 x10 3/mm — ABNORMAL HIGH (ref 0.2–0.9)
Monocyte %: 4.8 %
Neutrophil #: 26.4 10*3/uL — ABNORMAL HIGH (ref 1.4–6.5)
Neutrophil %: 79.7 %
Platelet: 324 10*3/uL (ref 150–440)
RBC: 4.16 10*6/uL (ref 3.80–5.20)
WBC: 33.1 10*3/uL — ABNORMAL HIGH (ref 3.6–11.0)

## 2013-04-10 LAB — HEMATOCRIT: HCT: 32.6 % — ABNORMAL LOW (ref 35.0–47.0)

## 2013-04-11 ENCOUNTER — Encounter: Payer: Self-pay | Admitting: Women's Health

## 2013-04-12 ENCOUNTER — Other Ambulatory Visit (HOSPITAL_COMMUNITY): Payer: Medicaid Other

## 2013-04-12 LAB — PATHOLOGY REPORT

## 2013-04-14 ENCOUNTER — Inpatient Hospital Stay (HOSPITAL_COMMUNITY): Admission: AD | Admit: 2013-04-14 | Payer: Self-pay | Source: Ambulatory Visit | Admitting: Obstetrics and Gynecology

## 2013-04-14 ENCOUNTER — Encounter (HOSPITAL_COMMUNITY): Admission: AD | Payer: Self-pay | Source: Ambulatory Visit

## 2013-04-14 SURGERY — Surgical Case
Anesthesia: Regional

## 2013-07-12 ENCOUNTER — Ambulatory Visit: Payer: Self-pay | Admitting: Women's Health

## 2013-07-23 ENCOUNTER — Encounter (HOSPITAL_COMMUNITY): Payer: Self-pay | Admitting: *Deleted

## 2014-04-24 ENCOUNTER — Encounter (HOSPITAL_COMMUNITY): Payer: Self-pay | Admitting: *Deleted

## 2014-05-02 ENCOUNTER — Emergency Department (HOSPITAL_COMMUNITY)
Admission: EM | Admit: 2014-05-02 | Discharge: 2014-05-02 | Disposition: A | Discharge status: Home / Self Care | Payer: Medicaid Other | Attending: Emergency Medicine | Admitting: Emergency Medicine

## 2014-05-02 ENCOUNTER — Encounter (HOSPITAL_COMMUNITY): Payer: Self-pay | Admitting: Emergency Medicine

## 2014-05-02 DIAGNOSIS — R51 Headache: Secondary | ICD-10-CM | POA: Insufficient documentation

## 2014-05-02 DIAGNOSIS — Z720 Tobacco use: Secondary | ICD-10-CM | POA: Insufficient documentation

## 2014-05-02 DIAGNOSIS — K029 Dental caries, unspecified: Secondary | ICD-10-CM | POA: Insufficient documentation

## 2014-05-02 DIAGNOSIS — R569 Unspecified convulsions: Secondary | ICD-10-CM | POA: Insufficient documentation

## 2014-05-02 DIAGNOSIS — K006 Disturbances in tooth eruption: Secondary | ICD-10-CM | POA: Insufficient documentation

## 2014-05-02 DIAGNOSIS — K088 Other specified disorders of teeth and supporting structures: Secondary | ICD-10-CM | POA: Insufficient documentation

## 2014-05-02 DIAGNOSIS — K0889 Other specified disorders of teeth and supporting structures: Secondary | ICD-10-CM

## 2014-05-02 MED ORDER — ONDANSETRON HCL 4 MG PO TABS
4.0000 mg | ORAL_TABLET | Freq: Once | ORAL | Status: AC
  Administered 2014-05-02: 4 mg via ORAL
  Filled 2014-05-02: qty 1

## 2014-05-02 MED ORDER — AMOXICILLIN 500 MG PO CAPS: 500.0000 mg | ORAL_CAPSULE | Freq: Three times a day (TID) | ORAL | Status: DC

## 2014-05-02 MED ORDER — AMOXICILLIN 250 MG PO CAPS
500.0000 mg | ORAL_CAPSULE | Freq: Once | ORAL | Status: AC
  Administered 2014-05-02: 500 mg via ORAL
  Filled 2014-05-02: qty 2

## 2014-05-02 MED ORDER — ACETAMINOPHEN-CODEINE #3 300-30 MG PO TABS
2.0000 | ORAL_TABLET | Freq: Once | ORAL | Status: AC
  Administered 2014-05-02: 2 via ORAL
  Filled 2014-05-02: qty 2

## 2014-05-02 MED ORDER — KETOROLAC TROMETHAMINE 10 MG PO TABS
10.0000 mg | ORAL_TABLET | Freq: Once | ORAL | Status: AC
  Administered 2014-05-02: 10 mg via ORAL
  Filled 2014-05-02: qty 1

## 2014-05-02 MED ORDER — ACETAMINOPHEN-CODEINE #3 300-30 MG PO TABS: 1.0000 | ORAL_TABLET | Freq: Four times a day (QID) | ORAL | Status: DC | PRN

## 2014-05-02 MED ORDER — DICLOFENAC SODIUM 75 MG PO TBEC: 75.0000 mg | DELAYED_RELEASE_TABLET | Freq: Two times a day (BID) | ORAL | Status: DC

## 2014-05-02 NOTE — ED Provider Notes (Signed)
CSN: [id number]     Arrival date & time 05/02/14  1925 History   First MD Initiated Contact with Patient 05/02/14 2025     Chief Complaint  Patient presents with  . Dental Pain     (Consider location/radiation/quality/duration/timing/severity/associated sxs/prior Treatment) Patient is a 28 y.o. female presenting with tooth pain. The history is provided by the patient.  Dental Pain Location:  Upper Upper teeth location:  4/RU 2nd bicuspid and 3/RU 1st molar Quality:  Aching and throbbing Severity:  Severe Onset quality:  Gradual Duration:  3 days Timing:  Intermittent Progression:  Worsening Chronicity:  Chronic Context: dental caries and poor dentition   Context: not abscess   Relieved by:  Nothing Worsened by:  Nothing tried Ineffective treatments:  None tried Associated symptoms: facial pain, gum swelling and headaches   Associated symptoms: no difficulty swallowing, no fever and no neck pain   Risk factors: lack of dental care and smoking     Past Medical History  Diagnosis Date  . Seizures    Past Surgical History  Procedure Laterality Date  . Cesarean section     Family History  Problem Relation Age of Onset  . Cancer Maternal Grandmother     breast  . Cancer Other     breast; maternal grandma's sister.   History  Substance Use Topics  . Smoking status: Current Every Day Smoker    Types: Cigarettes  . Smokeless tobacco: Never Used  . Alcohol Use: No   OB History    Gravida Para Term Preterm AB TAB SAB Ectopic Multiple Living   3 2 2       2      Review of Systems  Constitutional: Negative for fever and activity change.       All ROS Neg except as noted in HPI  HENT: Positive for dental problem. Negative for nosebleeds.   Eyes: Negative for photophobia and discharge.  Respiratory: Negative for cough, shortness of breath and wheezing.   Cardiovascular: Negative for chest pain and palpitations.  Gastrointestinal: Negative for abdominal pain and blood  in stool.  Genitourinary: Negative for dysuria, frequency and hematuria.  Musculoskeletal: Negative for back pain, arthralgias and neck pain.  Skin: Negative.   Neurological: Positive for seizures and headaches. Negative for dizziness and speech difficulty.  Psychiatric/Behavioral: Negative for hallucinations and confusion.      Allergies  Review of patient's allergies indicates no known allergies.  Home Medications   Prior to Admission medications   Medication Sig Start Date End Date Taking? Authorizing Provider  prenatal vitamin w/FE, FA (PRENATAL 1 + 1) 27-1 MG TABS Take 1 tablet by mouth daily at 12 noon. 10/25/12   Cheron EveryKimberly Randall Booker, CNM   BP 146/108 mmHg  Pulse 96  Temp(Src) 97.9 F (36.6 C) (Oral)  Resp 20  Ht 5\' 2"  (1.575 m)  Wt 180 lb (81.647 kg)  BMI 32.91 kg/m2  SpO2 100%  LMP 04/24/2014 Physical Exam  Constitutional: She is oriented to person, place, and time. She appears well-developed and well-nourished.  Non-toxic appearance.  HENT:  Head: Normocephalic.  Right Ear: Tympanic membrane and external ear normal.  Left Ear: Tympanic membrane and external ear normal.  Multiple dental caries of the right upper jaw, from the second premolar to the posterior molars. There is swelling of the thumb, but no visible abscess. Airway is patent. No swelling under the tongue.  Eyes: EOM and lids are normal. Pupils are equal, round, and reactive to light.  Neck:  Normal range of motion. Neck supple. Carotid bruit is not present.  Cardiovascular: Normal rate, regular rhythm, normal heart sounds, intact distal pulses and normal pulses.  Exam reveals no gallop and no friction rub.   No murmur heard. Pulmonary/Chest: Breath sounds normal. No respiratory distress. She has no wheezes. She has no rales.  Abdominal: Soft. Bowel sounds are normal. There is no tenderness. There is no guarding.  Musculoskeletal: Normal range of motion.  Lymphadenopathy:       Head (right side): No  submandibular adenopathy present.       Head (left side): No submandibular adenopathy present.    She has no cervical adenopathy.  Neurological: She is alert and oriented to person, place, and time. She has normal strength. No cranial nerve deficit or sensory deficit.  Skin: Skin is warm and dry.  Psychiatric: She has a normal mood and affect. Her speech is normal.  Nursing note and vitals reviewed.   ED Course  Procedures (including critical care time) Labs Review Labs Reviewed - No data to display  Imaging Review No results found.   EKG Interpretation None      MDM  Vital signs within normal limits with exception of the blood pressure being elevated at 146/108. Pulse oximetry is 100% on room air. Within normal limits by my interpretation.  No visible abscess appreciated at this time. No evidence forLudwig's angina.  Plan at this time is for the patient to be placed on Amoxil, diclofenac, Tylenol codeine. Patient has an appointment for next week to see the dentist.   Final diagnoses:  None    *I have reviewed nursing notes, vital signs, and all appropriate lab and imaging results for this patient.[address]    Areebah Meinders Garry HeaterM Katrenia Alkins, PA-C 05/02/14 2037  Vida RollerBrian D Miller, MD 05/03/14 1600

## 2014-05-02 NOTE — ED Notes (Signed)
Pt verbalized understanding of no driving and to use caution within 4 hours of taking pain meds due to meds cause drowsiness 

## 2014-05-02 NOTE — ED Notes (Signed)
Pt c/o pain to the right side of her mouth. States she thinks she has an abscessed tooth. Called dentist and can't get in till Monday.

## 2014-05-02 NOTE — Discharge Instructions (Signed)

## 2014-06-28 ENCOUNTER — Emergency Department: Payer: Self-pay | Admitting: Emergency Medicine

## 2014-10-13 NOTE — Op Note (Signed)
PATIENT NAME:  Gabriela Woodard, LABORDE MR#:  [id number] DATE OF BIRTH:  09-Dec-1985  DATE OF PROCEDURE:  04/09/2013  PREOPERATIVE DIAGNOSIS: Prior cesarean section and placental abruption.   POSTOPERATIVE DIAGNOSIS: Prior cesarean section and placental abruption.   PROCEDURE:  Repeat low transverse cesarean section.   ANESTHESIA:  Spinal.  SURGEON: Senaida Lange, M.D.   ASSISTANT: Tammy Brothers, CNM.   ESTIMATED BLOOD LOSS: 300 mL.  OPERATIVE FLUIDS: 1200 mL.   COMPLICATIONS: None.   FINDINGS: Vertex female infant, 2450 grams, Apgars 6 and 9, adhesions of the bowel, omentum to the anterior uterus, preventing exteriorization, tubes and ovaries not seen, dark red blood. A hysterotomy, clot in the lower uterine segment.   SPECIMENS: Placenta and cord blood and gas.   INDICATIONS: The patient is a 29 year old G3, P2, who presents with bright red bleeding after onset of abdominal pain, several hours prior to presentation. Placental abruption was suspected. The patient had history of prior cesarean section x2. A repeat C-section was performed for delivery. Risks, benefits, indications, and alternatives of the procedure were explained, and informed consent was obtained.   PROCEDURE: The patient was taken to the Operating Room with IV fluids running. She was prepped and draped in the usual sterile fashion with a leftward tilt. A Pfannenstiel skin incision was made and carried down to the underlying fascia with the knife. The fascia was nicked in the midline. The incision was extended laterally. The superior aspect of the fascia was grasped with Kochers, and the underlying rectus muscles were dissected off bluntly and sharply. This was repeated on the inferior fascia. The rectus muscles were divided in the midline with some difficulty secondary to previous scar.   The peritoneum was entered sharply with the Metzenbaum scissors. Adhesions of the bowel omentum to the anterior uterus and around to the  right lateral side of the uterus were encountered. The bladder flap was created with some difficulty secondary to adhesions. The hysterotomy incision was made and carried down to the underlying fetal membranes. The membranes were ruptured with pickup. The opening was extended. The infant's head was grasped and delivered atraumatically through the hysterotomy incision. The anterior and posterior shoulder were delivered, followed by the remainder of the body.   The cord was clamped x2 and cut, and the infant was handed to the waiting nursery staff. The placenta was expressed. After removal of the placenta, there were copious amounts of clot seen in is seen in the lower uterine segment, which was removed. Placental abruption is suspected. The hysterotomy incision was repaired, with the uterus in the abdomen, as exteriorization was not possible secondary to adhesions. The incision was closed using #0 Monocryl in a running locked fashion. The incision was examined and found to be hemostatic.   The rectus muscles were reapproximated with a 2-0 Vicryl. The On-Q pump apparatus was placed according to manufacturer's instructions. The fascia was repaired with a #1 PDS. The skin was closed with staples. The openings through which the On-Q catheters were placed were sealed using Dermabond skin glue. The catheters were each bolused with 0.5% bupivacaine, 5 mL. The catheters were secured to the patient's abdomen using Steri-Strips and Tegaderm. The patient tolerated the procedure well. Sponge, needle and instrument counts were correct x2, and the patient was taken to the recovery room in stable condition.  ____________________________ Sonda Primes Patton Salles, MD law:cg D: 04/09/2013 23:39:26 ET T: 04/10/2013 01:07:59 ET JOB#: [id number]  cc: Flint Melter A. Patton Salles, MD, <Dictator> Tehilla Coffel A WEAVER LEE MD  ELECTRONICALLY SIGNED 04/18/2013 10:30

## 2014-10-31 NOTE — H&P (Signed)
L&D Evaluation:  History Expanded:  HPI 29 yo G3 P2002, EDD of 04/21/13, presents at 6138 2/7 wks with c/o ctx since 1600 today with large gush of bleeding around 2000. [place] available currently. Reports A1GDM, no other complications. Prior C-section x 2.   Presents with abdominal pain, vaginal bleeding   Patient's Medical History No Chronic Illness   Patient's Surgical History Previous C-Section   Medications Pre Natal Vitamins  occasional Tylenol #3 for tooth pain   Allergies NKDA   Social History tobacco  1/2 PPD   Exam:  Vital Signs 147/77, 154/86, 142/96, 128/86   General appears uncomfortable and tearful   Mental Status clear   Chest clear   Heart no murmur/gallop/rubs   Abdomen gravid, tender with contractions, softened   Estimated Fetal Weight Average for gestational age   Edema no edema    Pelvic 1/90/-1   Mebranes bloody show vs frank bleeding   FHT baseline 150s, variable/late decels   Ucx regular   Impression:  Impression IUP at 38 2/7 wks with vaginal bleeding, previous c-section   Plan:  Comments Spoke with Dr Patton SallesWeaver-Lee - will proceed to cesarean delivery.  UDS Prenatal labs if unable to obtain records from other provider   Electronic Signatures: Vella KohlerBrothers, Young Brim K (CNM)  (Signed 18-Oct-14 22:01)  Authored: L&D Evaluation   Last Updated: 18-Oct-14 22:01 by Vella KohlerBrothers, Abagayle Klutts K (CNM)

## 2015-02-10 ENCOUNTER — Emergency Department
Admission: EM | Admit: 2015-02-10 | Discharge: 2015-02-10 | Disposition: A | Discharge status: Home / Self Care | Payer: Medicaid Other | Attending: Emergency Medicine | Admitting: Emergency Medicine

## 2015-02-10 DIAGNOSIS — K047 Periapical abscess without sinus: Secondary | ICD-10-CM

## 2015-02-10 DIAGNOSIS — Z792 Long term (current) use of antibiotics: Secondary | ICD-10-CM | POA: Insufficient documentation

## 2015-02-10 DIAGNOSIS — K088 Other specified disorders of teeth and supporting structures: Secondary | ICD-10-CM | POA: Insufficient documentation

## 2015-02-10 DIAGNOSIS — Z720 Tobacco use: Secondary | ICD-10-CM | POA: Insufficient documentation

## 2015-02-10 DIAGNOSIS — K029 Dental caries, unspecified: Secondary | ICD-10-CM | POA: Diagnosis not present

## 2015-02-10 DIAGNOSIS — Z79899 Other long term (current) drug therapy: Secondary | ICD-10-CM | POA: Diagnosis not present

## 2015-02-10 MED ORDER — LIDOCAINE-EPINEPHRINE 2 %-1:100000 IJ SOLN
INTRAMUSCULAR | Status: AC
  Filled 2015-02-10: qty 1.7

## 2015-02-10 MED ORDER — TRAMADOL HCL 50 MG PO TABS: 50.0000 mg | ORAL_TABLET | Freq: Four times a day (QID) | ORAL | Status: DC | PRN

## 2015-02-10 MED ORDER — PENICILLIN V POTASSIUM 500 MG PO TABS: 500.0000 mg | ORAL_TABLET | Freq: Four times a day (QID) | ORAL | Status: DC

## 2015-02-10 MED ORDER — LIDOCAINE-EPINEPHRINE 1 %-1:100000 IJ SOLN: 1.3000 mL | Freq: Once | INTRAMUSCULAR | Status: DC

## 2015-02-10 NOTE — Discharge Instructions (Signed)
Dental Abscess °A dental abscess is a collection of infected fluid (pus) from a bacterial infection in the inner part of the tooth (pulp). It usually occurs at the end of the tooth's root.  °CAUSES  °· Severe tooth decay. °· Trauma to the tooth that allows bacteria to enter into the pulp, such as a broken or chipped tooth. °SYMPTOMS  °· Severe pain in and around the infected tooth. °· Swelling and redness around the abscessed tooth or in the mouth or face. °· Tenderness. °· Pus drainage. °· Bad breath. °· Bitter taste in the mouth. °· Difficulty swallowing. °· Difficulty opening the mouth. °· Nausea. °· Vomiting. °· Chills. °· Swollen neck glands. °DIAGNOSIS  °· A medical and dental history will be taken. °· An examination will be performed by tapping on the abscessed tooth. °· X-rays may be taken of the tooth to identify the abscess. °TREATMENT °The goal of treatment is to eliminate the infection. You may be prescribed antibiotic medicine to stop the infection from spreading. A root canal may be performed to save the tooth. If the tooth cannot be saved, it may be pulled (extracted) and the abscess may be drained.  °HOME CARE INSTRUCTIONS °· Only take over-the-counter or prescription medicines for pain, fever, or discomfort as directed by your caregiver. °· Rinse your mouth (gargle) often with salt water (¼ tsp salt in 8 oz [250 ml] of warm water) to relieve pain or swelling. °· Do not drive after taking pain medicine (narcotics). °· Do not apply heat to the outside of your face. °· Return to your dentist for further treatment as directed. °SEEK MEDICAL CARE IF: °· Your pain is not helped by medicine. °· Your pain is getting worse instead of better. °SEEK IMMEDIATE MEDICAL CARE IF: °· You have a fever or persistent symptoms for more than 2-3 days. °· You have a fever and your symptoms suddenly get worse. °· You have chills or a very bad headache. °· You have problems breathing or swallowing. °· You have trouble  opening your mouth. °· You have swelling in the neck or around the eye. °Document Released: 06/09/2005 Document Revised: 03/03/2012 Document Reviewed: 09/17/2010 °ExitCare® Patient Information ©2015 [employer]. This information is not intended to replace advice given to you by your health care provider. Make sure you discuss any questions you have with your health care provider. ° °Dental Care and Dentist Visits °Dental care supports good overall health. Regular dental visits can also help you avoid dental pain, bleeding, infection, and other more serious health problems in the future. It is important to keep the mouth healthy because diseases in the teeth, gums, and other oral tissues can spread to other areas of the body. Some problems, such as diabetes, heart disease, and pre-term labor have been associated with poor oral health.  °See your dentist every 6 months. If you experience emergency problems such as a toothache or broken tooth, go to the dentist right away. If you see your dentist regularly, you may catch problems early. It is easier to be treated for problems in the early stages.  °WHAT TO EXPECT AT A DENTIST VISIT  °Your dentist will look for many common oral health problems and recommend proper treatment. At your regular dental visit, you can expect: °· Gentle cleaning of the teeth and gums. This includes scraping and polishing. This helps to remove the sticky substance around the teeth and gums (plaque). Plaque forms in the mouth shortly after eating. Over time, plaque hardens   on the teeth as tartar. If tartar is not removed regularly, it can cause problems. Cleaning also helps remove stains. °· Periodic X-rays. These pictures of the teeth and supporting bone will help your dentist assess the health of your teeth. °· Periodic fluoride treatments. Fluoride is a natural mineral shown to help strengthen teeth. Fluoride treatment involves applying a fluoride gel or varnish to the teeth. It is most  commonly done in children. °· Examination of the mouth, tongue, jaws, teeth, and gums to look for any oral health problems, such as: °¨ Cavities (dental caries). This is decay on the tooth caused by plaque, sugar, and acid in the mouth. It is best to catch a cavity when it is small. °¨ Inflammation of the gums caused by plaque buildup (gingivitis). °¨ Problems with the mouth or malformed or misaligned teeth. °¨ Oral cancer or other diseases of the soft tissues or jaws.  °KEEP YOUR TEETH AND GUMS HEALTHY °For healthy teeth and gums, follow these general guidelines as well as your dentist's specific advice: °· Have your teeth professionally cleaned at the dentist every 6 months. °· Brush twice daily with a fluoride toothpaste. °· Floss your teeth daily.  °· Ask your dentist if you need fluoride supplements, treatments, or fluoride toothpaste. °· Eat a healthy diet. Reduce foods and drinks with added sugar. °· Avoid smoking. °TREATMENT FOR ORAL HEALTH PROBLEMS °If you have oral health problems, treatment varies depending on the conditions present in your teeth and gums. °· Your caregiver will most likely recommend good oral hygiene at each visit. °· For cavities, gingivitis, or other oral health disease, your caregiver will perform a procedure to treat the problem. This is typically done at a separate appointment. Sometimes your caregiver will refer you to another dental specialist for specific tooth problems or for surgery. °SEEK IMMEDIATE DENTAL CARE IF: °· You have pain, bleeding, or soreness in the gum, tooth, jaw, or mouth area. °· A permanent tooth becomes loose or separated from the gum socket. °· You experience a blow or injury to the mouth or jaw area. °Document Released: 02/19/2011 Document Revised: 09/01/2011 Document Reviewed: 02/19/2011 °ExitCare® Patient Information ©2015 [employer]. This information is not intended to replace advice given to you by your health care provider. Make sure you discuss any  questions you have with your health care provider. ° °

## 2015-02-10 NOTE — ED Provider Notes (Signed)
CSN: [id number]     Arrival date & time 02/10/15  1904 History   First MD Initiated Contact with Patient 02/10/15 2044     Chief Complaint  Patient presents with  . Dental Pain    Pt c/o right upper toothache since yesterday. Unable to see dentist.     (Consider location/radiation/quality/duration/timing/severity/associated sxs/prior Treatment) HPI  29 year old female presents today for evaluation of right upper tooth pain. She points to tooth #2. Pain isn't present for 1 day. She describes the pain as constant throbbing. Pain is dull. Pain radiates up into her ear on the right side. Tried ibuprofen at home with no relief. She denies any fevers or difficulty swallowing. She has history of dental pain with tooth extraction.  Past Medical History  Diagnosis Date  . Seizures    Past Surgical History  Procedure Laterality Date  . Cesarean section     Family History  Problem Relation Age of Onset  . Cancer Maternal Grandmother     breast  . Cancer Other     breast; maternal grandma's sister.   Social History  Substance Use Topics  . Smoking status: Current Every Day Smoker    Types: Cigarettes  . Smokeless tobacco: Never Used  . Alcohol Use: No   OB History    Gravida Para Term Preterm AB TAB SAB Ectopic Multiple Living   3 2 2       2      Review of Systems  Constitutional: Negative.  Negative for fever and chills.  HENT: Positive for dental problem and facial swelling. Negative for drooling, mouth sores, trouble swallowing and voice change.   Respiratory: Negative for chest tightness and shortness of breath.   Cardiovascular: Negative for chest pain.  Gastrointestinal: Negative for nausea, vomiting, abdominal pain and diarrhea.  Musculoskeletal: Negative for arthralgias, neck pain and neck stiffness.  Skin: Negative.   Psychiatric/Behavioral: Negative for confusion.  All other systems reviewed and are negative.     Allergies  Review of patient's allergies indicates  no known allergies.  Home Medications   Prior to Admission medications   Medication Sig Start Date End Date Taking? Authorizing Provider  acetaminophen-codeine (TYLENOL #3) 300-30 MG per tablet Take 1-2 tablets by mouth every 6 (six) hours as needed. 05/02/14   Ivery Quale, PA-C  amoxicillin (AMOXIL) 500 MG capsule Take 1 capsule (500 mg total) by mouth 3 (three) times daily. 05/02/14   Ivery Quale, PA-C  diclofenac (VOLTAREN) 75 MG EC tablet Take 1 tablet (75 mg total) by mouth 2 (two) times daily. 05/02/14   Ivery Quale, PA-C  penicillin v potassium (VEETID) 500 MG tablet Take 1 tablet (500 mg total) by mouth 4 (four) times daily. 02/10/15   Evon Slack, PA-C  prenatal vitamin w/FE, FA (PRENATAL 1 + 1) 27-1 MG TABS Take 1 tablet by mouth daily at 12 noon. 10/25/12   Cheral Marker, CNM  traMADol (ULTRAM) 50 MG tablet Take 1 tablet (50 mg total) by mouth every 6 (six) hours as needed. 02/10/15   Evon Slack, PA-C   BP 146/89 mmHg  Pulse 103  Temp(Src) 98.3 F (36.8 C) (Oral)  Resp 18  Ht 5\' 2"  (1.575 m)  Wt 180 lb (81.647 kg)  BMI 32.91 kg/m2  SpO2 99% Physical Exam  Constitutional: She is oriented to person, place, and time. She appears well-developed and well-nourished. No distress.  HENT:  Head: Normocephalic and atraumatic.  Right Ear: External ear normal.  Left Ear: External  ear normal.  Nose: Nose normal.  Mouth/Throat: Uvula is midline and oropharynx is clear and moist. No oral lesions. No trismus in the jaw. Normal dentition. Dental abscesses and dental caries present. No uvula swelling.    Eyes: EOM are normal.  Neck: Normal range of motion. Neck supple.  Cardiovascular: Normal rate.  Exam reveals no gallop and no friction rub.   No murmur heard. Pulmonary/Chest: Effort normal and breath sounds normal. No respiratory distress.  Neurological: She is alert and oriented to person, place, and time.  Skin: Skin is warm and dry.  Psychiatric: She has a normal  mood and affect. Her behavior is normal. Thought content normal.    ED Course  Procedures (including critical care time) Dental block: Patient was injected with 1.5 cc of lidocaine with epi into the base of the gum and tooth #2. Lidocaine with epi was injected in the direction of the nerve root. Patient had complete relief of pain after procedure. Patient tolerated the procedure well.   Labs Review Labs Reviewed - No data to display  Imaging Review No results found. I have personally reviewed and evaluated these images and lab results as part of my medical decision-making.   EKG Interpretation None      MDM   Final diagnoses:  Abscess, dental  Pain due to dental caries    29 year old female with 1 day history of right upper tooth pain. She was given dental block, this did provide excellent relief. She will be started with penicillin VK 500 mg 1 tab by mouth 4 times a day. She is given tramadol for pain. Continue with ibuprofen for additional pain relief. Follow-up with dentist next week. Return to the ER for any worsening symptoms urgent changes in her health.    Evon Slack, PA-C 02/10/15 2052  Loleta Rose, MD 02/11/15 [phone]

## 2015-02-10 NOTE — ED Notes (Signed)
Pt c/o right upper tooth ache since yesterday. 

## 2015-06-08 ENCOUNTER — Encounter (HOSPITAL_COMMUNITY): Payer: Self-pay | Admitting: Emergency Medicine

## 2015-06-08 ENCOUNTER — Emergency Department (HOSPITAL_COMMUNITY)
Admission: EM | Admit: 2015-06-08 | Discharge: 2015-06-08 | Disposition: A | Discharge status: Home / Self Care | Payer: Medicaid Other | Attending: Emergency Medicine | Admitting: Emergency Medicine

## 2015-06-08 DIAGNOSIS — Z792 Long term (current) use of antibiotics: Secondary | ICD-10-CM | POA: Diagnosis not present

## 2015-06-08 DIAGNOSIS — Z79899 Other long term (current) drug therapy: Secondary | ICD-10-CM | POA: Insufficient documentation

## 2015-06-08 DIAGNOSIS — K029 Dental caries, unspecified: Secondary | ICD-10-CM | POA: Diagnosis not present

## 2015-06-08 DIAGNOSIS — K0889 Other specified disorders of teeth and supporting structures: Secondary | ICD-10-CM | POA: Diagnosis not present

## 2015-06-08 DIAGNOSIS — F17210 Nicotine dependence, cigarettes, uncomplicated: Secondary | ICD-10-CM | POA: Insufficient documentation

## 2015-06-08 MED ORDER — DICLOFENAC SODIUM 50 MG PO TBEC: 50.0000 mg | DELAYED_RELEASE_TABLET | Freq: Two times a day (BID) | ORAL | Status: DC

## 2015-06-08 MED ORDER — CLINDAMYCIN HCL 150 MG PO CAPS: 150.0000 mg | ORAL_CAPSULE | Freq: Four times a day (QID) | ORAL | Status: DC

## 2015-06-08 NOTE — ED Notes (Signed)
Patient complaining of upper right dental pain x 2 weeks.

## 2015-06-08 NOTE — Discharge Instructions (Signed)

## 2015-06-08 NOTE — ED Provider Notes (Signed)
CSN: [id number]     Arrival date & time 06/08/15  1303 History   First MD Initiated Contact with Patient 06/08/15 1415     Chief Complaint  Patient presents with  . Dental Pain     (Consider location/radiation/quality/duration/timing/severity/associated sxs/prior Treatment) Patient is a 29 y.o. female presenting with tooth pain. The history is provided by the patient. No language interpreter was used.  Dental Pain Location:  Lower and upper Upper teeth location:  8/RU central incisor and 7/RU lateral incisor Quality:  Aching Severity:  Moderate Onset quality:  Gradual Duration:  2 weeks Timing:  Constant Progression:  Worsening Chronicity:  New Relieved by:  Nothing Worsened by:  Nothing tried Ineffective treatments:  None tried Associated symptoms: no gum swelling     Past Medical History  Diagnosis Date  . Seizures [facility](HCC)    Past Surgical History  Procedure Laterality Date  . Cesarean section     Family History  Problem Relation Age of Onset  . Cancer Maternal Grandmother     breast  . Cancer Other     breast; maternal grandma's sister.   Social History  Substance Use Topics  . Smoking status: Current Every Day Smoker    Types: Cigarettes  . Smokeless tobacco: Never Used  . Alcohol Use: No   OB History    Gravida Para Term Preterm AB TAB SAB Ectopic Multiple Living   3 2 2       2      Review of Systems  All other systems reviewed and are negative.     Allergies  Review of patient's allergies indicates no known allergies.  Home Medications   Prior to Admission medications   Medication Sig Start Date End Date Taking? Authorizing Provider  acetaminophen-codeine (TYLENOL #3) 300-30 MG per tablet Take 1-2 tablets by mouth every 6 (six) hours as needed. 05/02/14   Ivery QualeHobson Bryant, PA-C  amoxicillin (AMOXIL) 500 MG capsule Take 1 capsule (500 mg total) by mouth 3 (three) times daily. 05/02/14   Ivery QualeHobson Bryant, PA-C  clindamycin (CLEOCIN) 150 MG capsule  Take 1 capsule (150 mg total) by mouth every 6 (six) hours. 06/08/15   Elson AreasLeslie K Sofia, PA-C  diclofenac (VOLTAREN) 50 MG EC tablet Take 1 tablet (50 mg total) by mouth 2 (two) times daily. 06/08/15   Elson AreasLeslie K Sofia, PA-C  penicillin v potassium (VEETID) 500 MG tablet Take 1 tablet (500 mg total) by mouth 4 (four) times daily. 02/10/15   Evon Slackhomas C Gaines, PA-C  prenatal vitamin w/FE, FA (PRENATAL 1 + 1) 27-1 MG TABS Take 1 tablet by mouth daily at 12 noon. 10/25/12   Cheral MarkerKimberly R Booker, CNM  traMADol (ULTRAM) 50 MG tablet Take 1 tablet (50 mg total) by mouth every 6 (six) hours as needed. 02/10/15   Evon Slackhomas C Gaines, PA-C   BP 150/90 mmHg  Pulse 73  Temp(Src) 97.9 F (36.6 C) (Oral)  Resp 16  Ht 5\' 4"  (1.626 m)  Wt 81.647 kg  BMI 30.88 kg/m2  SpO2 100%  LMP 05/21/2015 Physical Exam  Constitutional: She is oriented to person, place, and time. She appears well-developed and well-nourished.  HENT:  Head: Normocephalic.  Severe decay,   Eyes: EOM are normal.  Neck: Normal range of motion.  Pulmonary/Chest: Effort normal.  Abdominal: She exhibits no distension.  Musculoskeletal: Normal range of motion.  Neurological: She is alert and oriented to person, place, and time.  Psychiatric: She has a normal mood and affect.  Nursing note and  vitals reviewed.   ED Course  Procedures (including critical care time) Labs Review Labs Reviewed - No data to display  Imaging Review No results found. I have personally reviewed and evaluated these images and lab results as part of my medical decision-making.   EKG Interpretation None      MDM   Final diagnoses:  Toothache    Meds ordered this encounter  Medications  . clindamycin (CLEOCIN) 150 MG capsule    Sig: Take 1 capsule (150 mg total) by mouth every 6 (six) hours.    Dispense:  28 capsule    Refill:  0    Order Specific Question:  Supervising Provider    Answer:  MILLER, BRIAN [[id number]]  . diclofenac (VOLTAREN) 50 MG EC tablet     Sig: Take 1 tablet (50 mg total) by mouth 2 (two) times daily.    Dispense:  20 tablet    Refill:  0    Order Specific Question:  Supervising Provider    Answer:  Eber Hong [[id number]]    An After Visit Summary was printed and given to the patient.  Lonia Skinner La Villita, PA-C 06/08/15 1615  Nelva Nay, MD 06/09/15 [phone]

## 2015-09-15 ENCOUNTER — Emergency Department
Admission: EM | Admit: 2015-09-15 | Discharge: 2015-09-15 | Disposition: A | Discharge status: Home / Self Care | Payer: Medicaid Other | Attending: Emergency Medicine | Admitting: Emergency Medicine

## 2015-09-15 DIAGNOSIS — B029 Zoster without complications: Secondary | ICD-10-CM | POA: Insufficient documentation

## 2015-09-15 DIAGNOSIS — Z79899 Other long term (current) drug therapy: Secondary | ICD-10-CM | POA: Diagnosis not present

## 2015-09-15 DIAGNOSIS — Z791 Long term (current) use of non-steroidal anti-inflammatories (NSAID): Secondary | ICD-10-CM | POA: Diagnosis not present

## 2015-09-15 DIAGNOSIS — F17210 Nicotine dependence, cigarettes, uncomplicated: Secondary | ICD-10-CM | POA: Diagnosis not present

## 2015-09-15 DIAGNOSIS — Z792 Long term (current) use of antibiotics: Secondary | ICD-10-CM | POA: Insufficient documentation

## 2015-09-15 DIAGNOSIS — R21 Rash and other nonspecific skin eruption: Secondary | ICD-10-CM | POA: Diagnosis present

## 2015-09-15 MED ORDER — OXYCODONE-ACETAMINOPHEN 5-325 MG PO TABS
1.0000 | ORAL_TABLET | Freq: Once | ORAL | Status: AC
  Administered 2015-09-15: 1 via ORAL

## 2015-09-15 MED ORDER — OXYCODONE-ACETAMINOPHEN 5-325 MG PO TABS
ORAL_TABLET | ORAL | Status: AC
  Filled 2015-09-15: qty 1

## 2015-09-15 MED ORDER — ACYCLOVIR 800 MG PO TABS: 800.0000 mg | ORAL_TABLET | Freq: Every day | ORAL | Status: DC

## 2015-09-15 MED ORDER — OXYCODONE-ACETAMINOPHEN 5-325 MG PO TABS: 1.0000 | ORAL_TABLET | Freq: Four times a day (QID) | ORAL | Status: DC | PRN

## 2015-09-15 NOTE — ED Notes (Signed)
Pt reports she developed a blistering rash to her right lower back on 09/13/15. Pt states for 3 days prior she has some pain to the region. Pt has taken benadry 25 mg this morning and and 50 mg of tramadol around the same time.

## 2015-09-15 NOTE — Discharge Instructions (Signed)
Shingles °Shingles, which is also known as herpes zoster, is an infection that causes a painful skin rash and fluid-filled blisters. Shingles is not related to genital herpes, which is a sexually transmitted infection.  ° °Shingles only develops in people who: °· Have had chickenpox. °· Have received the chickenpox vaccine. (This is rare.) °CAUSES °Shingles is caused by varicella-zoster virus (VZV). This is the same virus that causes chickenpox. After exposure to VZV, the virus stays in the body in an inactive (dormant) state. Shingles develops if the virus reactivates. This can happen many years after the initial exposure to VZV. It is not known what causes this virus to reactivate. °RISK FACTORS °People who have had chickenpox or received the chickenpox vaccine are at risk for shingles. Infection is more common in people who: °· Are older than age 50. °· Have a weakened defense (immune) system, such as those with HIV, AIDS, or cancer. °· Are taking medicines that weaken the immune system, such as transplant medicines. °· Are under great stress. °SYMPTOMS °Early symptoms of this condition include itching, tingling, and pain in an area on your skin. Pain may be described as burning, stabbing, or throbbing. °A few days or weeks after symptoms start, a painful red rash appears, usually on one side of the body in a bandlike or beltlike pattern. The rash eventually turns into fluid-filled blisters that break open, scab over, and dry up in about 2-3 weeks. °At any time during the infection, you may also develop: °· A fever. °· Chills. °· A headache. °· An upset stomach. °DIAGNOSIS °This condition is diagnosed with a skin exam. Sometimes, skin or fluid samples are taken from the blisters before a diagnosis is made. These samples are examined under a microscope or sent to a lab for testing. °TREATMENT °There is no specific cure for this condition. Your health care provider will probably prescribe medicines to help you  manage pain, recover more quickly, and avoid long-term problems. Medicines may include: °· Antiviral drugs. °· Anti-inflammatory drugs. °· Pain medicines. °If the area involved is on your face, you may be referred to a specialist, such as an eye doctor (ophthalmologist) or an ear, nose, and throat (ENT) doctor to help you avoid eye problems, chronic pain, or disability. °HOME CARE INSTRUCTIONS °Medicines °· Take medicines only as directed by your health care provider. °· Apply an anti-itch or numbing cream to the affected area as directed by your health care provider. °Blister and Rash Care °· Take a cool bath or apply cool compresses to the area of the rash or blisters as directed by your health care provider. This may help with pain and itching. °· Keep your rash covered with a loose bandage (dressing). Wear loose-fitting clothing to help ease the pain of material rubbing against the rash. °· Keep your rash and blisters clean with mild soap and cool water or as directed by your health care provider. °· Check your rash every day for signs of infection. These include redness, swelling, and pain that lasts or increases. °· Do not pick your blisters. °· Do not scratch your rash. °General Instructions °· Rest as directed by your health care provider. °· Keep all follow-up visits as directed by your health care provider. This is important. °· Until your blisters scab over, your infection can cause chickenpox in people who have never had it or been vaccinated against it. To prevent this from happening, avoid contact with other people, especially: °· Babies. °· Pregnant women. °· Children   who have eczema. °· Elderly people who have transplants. °· People who have chronic illnesses, such as leukemia or AIDS. °SEEK MEDICAL CARE IF: °· Your pain is not relieved with prescribed medicines. °· Your pain does not get better after the rash heals. °· Your rash looks infected. Signs of infection include redness, swelling, and pain  that lasts or increases. °SEEK IMMEDIATE MEDICAL CARE IF: °· The rash is on your face or nose. °· You have facial pain, pain around your eye area, or loss of feeling on one side of your face. °· You have ear pain or you have ringing in your ear. °· You have loss of taste. °· Your condition gets worse. °  °This information is not intended to replace advice given to you by your health care provider. Make sure you discuss any questions you have with your health care provider. °  °Document Released: 06/09/2005 Document Revised: 06/30/2014 Document Reviewed: 04/20/2014 °Elsevier Interactive Patient Education ©2016 [employer] ° °Neuropathic Pain °Neuropathic pain is pain caused by damage to the nerves that are responsible for certain sensations in your body (sensory nerves). The pain can be caused by damage to:  °· The sensory nerves that send signals to your spinal cord and brain (peripheral nervous system). °· The sensory nerves in your brain or spinal cord (central nervous system). °Neuropathic pain can make you more sensitive to pain. What would be a minor sensation for most people may feel very painful if you have neuropathic pain. This is usually a long-term condition that can be difficult to treat. The type of pain can differ from person to person. It may start suddenly (acute), or it may develop slowly and last for a long time (chronic). Neuropathic pain may come and go as damaged nerves heal or may stay at the same level for years. It often causes emotional distress, loss of sleep, and a lower quality of life. °CAUSES  °The most common cause of damage to a sensory nerve is diabetes. Many other diseases and conditions can also cause neuropathic pain. Causes of neuropathic pain can be classified as: °· Toxic. Many drugs and chemicals can cause toxic damage. The most common cause of toxic neuropathic pain is damage from drug treatment for cancer (chemotherapy). °· Metabolic. This type of pain can happen when a  disease causes imbalances that damage nerves. Diabetes is the most common of these diseases. Vitamin B deficiency caused by long-term alcohol abuse is another common cause. °· Traumatic. Any injury that cuts, crushes, or stretches a nerve can cause damage and pain. A common example is feeling pain after losing an arm or leg (phantom limb pain). °· Compression-related. If a sensory nerve gets trapped or compressed for a long period of time, the blood supply to the nerve can be cut off. °· Vascular. Many blood vessel diseases can cause neuropathic pain by decreasing blood supply and oxygen to nerves. °· Autoimmune. This type of pain results from diseases in which the body's defense system mistakenly attacks sensory nerves. Examples of autoimmune diseases that can cause neuropathic pain include lupus and multiple sclerosis. °· Infectious. Many types of viral infections can damage sensory nerves and cause pain. Shingles infection is a common cause of this type of pain. °· Inherited. Neuropathic pain can be a symptom of many diseases that are passed down through families (genetic). °SIGNS AND SYMPTOMS  °The main symptom is pain. Neuropathic pain is often described as: °· Burning. °· Shock-like. °· Stinging. °· Hot or cold. °·   Itching. °DIAGNOSIS  °No single test can diagnose neuropathic pain. Your health care provider will do a physical exam and ask you about your pain. You may use a pain scale to describe how bad your pain is. You may also have tests to see if you have a high sensitivity to pain and to help find the cause and location of any sensory nerve damage. These tests may include: °· Imaging studies, such as: °¨ X-rays. °¨ CT scan. °¨ MRI. °· Nerve conduction studies to test how well nerve signals travel through your sensory nerves (electrodiagnostic testing). °· Stimulating your sensory nerves through electrodes on your skin and measuring the response in your spinal cord and brain (somatosensory evoked  potentials). °TREATMENT  °Treatment for neuropathic pain may change over time. You may need to try different treatment options or a combination of treatments. Some options include: °· Over-the-counter pain relievers. °· Prescription medicines. Some medicines used to treat other conditions may also help neuropathic pain. These include medicines to: °¨ Control seizures (anticonvulsants). °¨ Relieve depression (antidepressants). °· Prescription-strength pain relievers (narcotics). These are usually used when other pain relievers do not help. °· Transcutaneous nerve stimulation (TENS). This uses electrical currents to block painful nerve signals. The treatment is painless. °· Topical and local anesthetics. These are medicines that numb the nerves. They can be injected as a nerve block or applied to the skin. °· Alternative treatments, such as: °¨ Acupuncture. °¨ Meditation. °¨ Massage. °¨ Physical therapy. °¨ Pain management programs. °¨ Counseling. °HOME CARE INSTRUCTIONS °· Learn as much as you can about your condition. °· Take medicines only as directed by your health care provider. °· Work closely with all your health care providers to find what works best for you. °· Have a good support system at home. °· Consider joining a chronic pain support group. °SEEK MEDICAL CARE IF: °· Your pain treatments are not helping. °· You are having side effects from your medicines. °· You are struggling with fatigue, mood changes, depression, or anxiety. °  °This information is not intended to replace advice given to you by your health care provider. Make sure you discuss any questions you have with your health care provider. °  °Document Released: 03/06/2004 Document Revised: 06/30/2014 Document Reviewed: 11/17/2013 °Elsevier Interactive Patient Education ©2016 [employer] Inc[employer] ° °

## 2015-09-15 NOTE — ED Provider Notes (Signed)
[facility] Emergency Department Provider Note  ____________________________________________  Time seen: Approximately 8:35 PM  I have reviewed the triage vital signs and the nursing notes.   HISTORY  Chief Complaint Rash    HPI Gabriela Woodard is a 30 y.o. female who presents to emergency department complaining of a blistering rash to her right lower back. Patient states that she developed a burning sensation 3 days prior to development of rash. Rash is been present 2 days. Patient states that area is extremely painful and describes it as a burning sensation. Rash is linear in nature stretching from the middle of her back towards the right side. Patient is unsure whether she had chickenpox as a child. Patient has tried Tylenol and prescribed 50 mg of tramadol with no relief.   Past Medical History  Diagnosis Date  . Seizures [facility](HCC)     Patient Active Problem List   Diagnosis Date Noted  . Leukocytosis, unspecified 03/17/2013  . Gestational diabetes mellitus, class A1 02/03/2013  . H/O gestational diabetes in prior pregnancy, currently pregnant 11/29/2012  . Rh negative state in antepartum period 10/26/2012  . Supervision of other normal pregnancy 10/25/2012  . Previous cesarean section 10/25/2012    Past Surgical History  Procedure Laterality Date  . Cesarean section      Current Outpatient Rx  Name  Route  Sig  Dispense  Refill  . acetaminophen-codeine (TYLENOL #3) 300-30 MG per tablet   Oral   Take 1-2 tablets by mouth every 6 (six) hours as needed.   20 tablet   0   . acyclovir (ZOVIRAX) 800 MG tablet   Oral   Take 1 tablet (800 mg total) by mouth 5 (five) times daily.   35 tablet   0   . amoxicillin (AMOXIL) 500 MG capsule   Oral   Take 1 capsule (500 mg total) by mouth 3 (three) times daily.   21 capsule   0   . clindamycin (CLEOCIN) 150 MG capsule   Oral   Take 1 capsule (150 mg total) by mouth every 6 (six) hours.   28  capsule   0   . diclofenac (VOLTAREN) 50 MG EC tablet   Oral   Take 1 tablet (50 mg total) by mouth 2 (two) times daily.   20 tablet   0   . oxyCODONE-acetaminophen (ROXICET) 5-325 MG tablet   Oral   Take 1 tablet by mouth every 6 (six) hours as needed for severe pain.   20 tablet   0   . penicillin v potassium (VEETID) 500 MG tablet   Oral   Take 1 tablet (500 mg total) by mouth 4 (four) times daily.   40 tablet   0   . prenatal vitamin w/FE, FA (PRENATAL 1 + 1) 27-1 MG TABS   Oral   Take 1 tablet by mouth daily at 12 noon.   30 each   12   . traMADol (ULTRAM) 50 MG tablet   Oral   Take 1 tablet (50 mg total) by mouth every 6 (six) hours as needed.   20 tablet   0     Allergies Review of patient's allergies indicates no known allergies.  Family History  Problem Relation Age of Onset  . Cancer Maternal Grandmother     breast  . Cancer Other     breast; maternal grandma's sister.    Social History Social History  Substance Use Topics  . Smoking status: Current Every  Day Smoker    Types: Cigarettes  . Smokeless tobacco: Never Used  . Alcohol Use: No     Review of Systems  Constitutional: No fever/chills Cardiovascular: no chest pain. Respiratory: no cough. No SOB. Skin: Positive for blistering rash in the right lower back. Neurological: Negative for headaches, focal weakness or numbness. 10-point ROS otherwise negative.  ____________________________________________   PHYSICAL EXAM:  VITAL SIGNS: ED Triage Vitals  Enc Vitals Group     BP 09/15/15 2023 127/83 mmHg     Pulse Rate 09/15/15 2023 88     Resp 09/15/15 2023 18     Temp 09/15/15 2023 98.4 F (36.9 C)     Temp Source 09/15/15 2023 Oral     SpO2 09/15/15 2023 97 %     Weight 09/15/15 2023 170 lb (77.111 kg)     Height 09/15/15 2023  (1.549 m)     Head Cir --      Peak Flow --      Pain Score --      Pain Loc --      Pain Edu? --      Excl. in GC? --       Constitutional: Alert and oriented. Well appearing and in no acute distress. Cardiovascular: Normal rate, regular rhythm. Normal S1 and S2.  Good peripheral circulation. Respiratory: Normal respiratory effort without tachypnea or retractions. Lungs CTAB. Neurologic:  Normal speech and language. No gross focal neurologic deficits are appreciated.  Skin:  Skin is warm, dry and intact. Erythematous blistering lesions are noted in a dermatomal distribution in the right lower back. This is approximately the L1 region. Area is very tender to palpation. Area is oozing. Psychiatric: Mood and affect are normal. Speech and behavior are normal. Patient exhibits appropriate insight and judgement.   ____________________________________________   LABS (all labs ordered are listed, but only abnormal results are displayed)  Labs Reviewed - No data to display ____________________________________________  EKG   ____________________________________________  RADIOLOGY   No results found.  ____________________________________________    PROCEDURES  Procedure(s) performed:       Medications  oxyCODONE-acetaminophen (PERCOCET/ROXICET) 5-325 MG per tablet (not administered)  oxyCODONE-acetaminophen (PERCOCET/ROXICET) 5-325 MG per tablet 1 tablet (1 tablet Oral Given 09/15/15 2045)     ____________________________________________   INITIAL IMPRESSION / ASSESSMENT AND PLAN / ED COURSE  Pertinent labs & imaging results that were available during my care of the patient were reviewed by me and considered in my medical decision making (see chart for details).  Patient's diagnosis is consistent with shingles. Patient has blistering lesions in a dermatomal distribution to the right lower back. Patient will be treated with antivirals and pain medication. She is encouraged to follow-up with primary care provider for further management. Patient is advised to keep all blistering lesions  covered to prevent transmission to others. She is given precautions to watch bed linens, towels, clothing in hot water.. Patient will be discharged home with prescriptions for acyclovir and Percocet. Patient is to follow up with primary care provider if symptoms persist past this treatment course. Patient is given ED precautions to return to the ED for any worsening or new symptoms.     ____________________________________________  FINAL CLINICAL IMPRESSION(S) / ED DIAGNOSES  Final diagnoses:  Shingles      NEW MEDICATIONS STARTED DURING THIS VISIT:  New Prescriptions   ACYCLOVIR (ZOVIRAX) 800 MG TABLET    Take 1 tablet (800 mg total) by mouth 5 (five) times daily.  OXYCODONE-ACETAMINOPHEN (ROXICET) 5-325 MG TABLET    Take 1 tablet by mouth every 6 (six) hours as needed for severe pain.        This chart was dictated using voice recognition software/Dragon. Despite best efforts to proofread, errors can occur which can change the meaning. Any change was purely unintentional.    Racheal Patches, PA-C 09/15/15 1610  Jennye Moccasin, MD 09/15/15 2108

## 2015-09-20 ENCOUNTER — Emergency Department
Admission: EM | Admit: 2015-09-20 | Discharge: 2015-09-20 | Disposition: A | Discharge status: Home / Self Care | Payer: Medicaid Other | Attending: Emergency Medicine | Admitting: Emergency Medicine

## 2015-09-20 ENCOUNTER — Encounter: Payer: Self-pay | Admitting: Emergency Medicine

## 2015-09-20 DIAGNOSIS — F17210 Nicotine dependence, cigarettes, uncomplicated: Secondary | ICD-10-CM | POA: Insufficient documentation

## 2015-09-20 DIAGNOSIS — Z792 Long term (current) use of antibiotics: Secondary | ICD-10-CM | POA: Insufficient documentation

## 2015-09-20 DIAGNOSIS — Z791 Long term (current) use of non-steroidal anti-inflammatories (NSAID): Secondary | ICD-10-CM | POA: Diagnosis not present

## 2015-09-20 DIAGNOSIS — B0229 Other postherpetic nervous system involvement: Secondary | ICD-10-CM | POA: Diagnosis not present

## 2015-09-20 DIAGNOSIS — Z760 Encounter for issue of repeat prescription: Secondary | ICD-10-CM | POA: Diagnosis present

## 2015-09-20 MED ORDER — OXYCODONE-ACETAMINOPHEN 5-325 MG PO TABS: 1.0000 | ORAL_TABLET | Freq: Four times a day (QID) | ORAL | Status: DC | PRN

## 2015-09-20 MED ORDER — PREDNISONE 10 MG (21) PO TBPK: 10.0000 mg | ORAL_TABLET | Freq: Every day | ORAL | Status: DC

## 2015-09-20 MED ORDER — LIDOCAINE 0.5 % EX GEL: 1.0000 "application " | Freq: Two times a day (BID) | CUTANEOUS | Status: DC

## 2015-09-20 MED ORDER — GABAPENTIN 300 MG PO CAPS: 300.0000 mg | ORAL_CAPSULE | Freq: Three times a day (TID) | ORAL | Status: DC

## 2015-09-20 NOTE — ED Notes (Signed)
States was seen in ED last week and diagnosed with Shingles.  Unable to see PCP until Tuesday.  Patient is out of pain medications and is here for more pain medication (percocet 5/325)

## 2015-09-20 NOTE — Discharge Instructions (Signed)
Postherpetic Neuralgia Postherpetic neuralgia (PHN) is nerve pain that occurs after a shingles infection. Shingles is a painful rash that appears on one side of the body, usually on your trunk or face. Shingles is caused by the varicella-zoster virus. This is the same virus that causes chickenpox. In people who have had chickenpox, the virus can resurface years later and cause shingles. You may have PHN if you continue to have pain for 3 months after your shingles rash has gone away. PHN appears in the same area where you had the shingles rash. For most people, PHN goes away within 1 year.  Getting a vaccination for shingles can prevent PHN. This vaccine is recommended for people older than 50. It may prevent shingles and may also lower your risk of PHN if you do get shingles. CAUSES PHN is caused by damage to your nerves from the varicella-zoster virus. This damage makes your nerves overly sensitive.  RISK FACTORS Aging is the biggest risk factor for developing PHN. Most people who get PHN are older than 60. Other risk factors include:  Having very bad pain before your shingles rash starts.  Having a very bad rash.  Having shingles in the nerve that supplies your face and eye (trigeminal nerve). SIGNS AND SYMPTOMS Pain is the main symptom of PHN. The pain is often very bad and may be described as stabbing, burning, or feeling like an electric shock. The pain may come and go or may be there all the time. Pain may be triggered by light touches on the skin or changes in temperature. You may have itching along with the pain. DIAGNOSIS  Your health care provider may diagnose PHN based on your symptoms and your history of shingles. Lab studies and other diagnostic tests are usually not needed. TREATMENT  There is no cure for PHN. Treatment for PHN will focus on pain relief. Over-the-counter pain relievers do not usually relieve PHN pain. You may need to work with a pain specialist. Treatment may  include:  Antidepressant medicines to help with pain and improve sleep.  Antiseizure medicines to relieve nerve pain.  Strong pain relievers (opioids).  A numbing patch worn on the skin (lidocaine patch). HOME CARE INSTRUCTIONS It may take a long time to recover from PHN. Work closely with your health care provider, and have a good support system at home.   Take all medicines as directed by your health care provider.  Wear loose, comfortable clothing.  Cover sensitive areas with a dressing to reduce friction from clothing rubbing on the area.  If cold does not make your pain worse, try applying a cool compress or cooling gel pack to the area.  Talk to your health care provider if you feel depressed or desperate. Living with long-term pain can be depressing. SEEK MEDICAL CARE IF:  Your medicine is not helping.  You are struggling to manage your pain at home.   This information is not intended to replace advice given to you by your health care provider. Make sure you discuss any questions you have with your health care provider.   Document Released: 08/30/2002 Document Revised: 06/30/2014 Document Reviewed: 05/31/2013 Elsevier Interactive Patient Education [employer]2016 Elsevier Inc.   Take the prescription meds as directed. Apply to lidocaine gel as directed for skin pain. Follow-up with your provider for continued pain management.

## 2015-09-20 NOTE — ED Notes (Signed)
Was dx'd with shingles last week. conts to have pain   Also has red area to left buttocks

## 2015-09-20 NOTE — ED Provider Notes (Signed)
[facility] Emergency Department Provider Note ____________________________________________  Time seen: 1400  I have reviewed the triage vital signs and the nursing notes.  HISTORY  Chief Complaint  Medication Refill  HPI Gabriela Woodard is a 30 y.o. female presents to the ED for continued pain following a diagnosis of shingles last week. Patient describes resolvingblisters with some crusted and scabbed and at this time, but notes continued pain to the right lower back region. She has dosed the previously prescribed Percocet completely. She denies any interim fevers, chills, sweats. She is unable to follow up with primary care provider until Tuesday.  Past Medical History  Diagnosis Date  . Seizures [facility])     Patient Active Problem List   Diagnosis Date Noted  . Leukocytosis, unspecified 03/17/2013  . Gestational diabetes mellitus, class A1 02/03/2013  . H/O gestational diabetes in prior pregnancy, currently pregnant 11/29/2012  . Rh negative state in antepartum period 10/26/2012  . Supervision of other normal pregnancy 10/25/2012  . Previous cesarean section 10/25/2012    Past Surgical History  Procedure Laterality Date  . Cesarean section      Current Outpatient Rx  Name  Route  Sig  Dispense  Refill  . acetaminophen-codeine (TYLENOL #3) 300-30 MG per tablet   Oral   Take 1-2 tablets by mouth every 6 (six) hours as needed.   20 tablet   0   . acyclovir (ZOVIRAX) 800 MG tablet   Oral   Take 1 tablet (800 mg total) by mouth 5 (five) times daily.   35 tablet   0   . amoxicillin (AMOXIL) 500 MG capsule   Oral   Take 1 capsule (500 mg total) by mouth 3 (three) times daily.   21 capsule   0   . clindamycin (CLEOCIN) 150 MG capsule   Oral   Take 1 capsule (150 mg total) by mouth every 6 (six) hours.   28 capsule   0   . diclofenac (VOLTAREN) 50 MG EC tablet   Oral   Take 1 tablet (50 mg total) by mouth 2 (two) times daily.   20 tablet  0   . gabapentin (NEURONTIN) 300 MG capsule   Oral   Take 1 capsule (300 mg total) by mouth 3 (three) times daily.   30 capsule   1   . Lidocaine 0.5 % GEL   Apply externally   Apply 1 application topically 2 times daily at 12 noon and 4 pm.   170 g   0   . oxyCODONE-acetaminophen (ROXICET) 5-325 MG tablet   Oral   Take 1 tablet by mouth every 6 (six) hours as needed for moderate pain or severe pain.   20 tablet   0   . penicillin v potassium (VEETID) 500 MG tablet   Oral   Take 1 tablet (500 mg total) by mouth 4 (four) times daily.   40 tablet   0   . predniSONE (STERAPRED UNI-PAK 21 TAB) 10 MG (21) TBPK tablet   Oral   Take 1 tablet (10 mg total) by mouth daily.   21 tablet   0   . prenatal vitamin w/FE, FA (PRENATAL 1 + 1) 27-1 MG TABS   Oral   Take 1 tablet by mouth daily at 12 noon.   30 each   12   . traMADol (ULTRAM) 50 MG tablet   Oral   Take 1 tablet (50 mg total) by mouth every 6 (six) hours as  needed.   20 tablet   0    Allergies Review of patient's allergies indicates no known allergies.  Family History  Problem Relation Age of Onset  . Cancer Maternal Grandmother     breast  . Cancer Other     breast; maternal grandma's sister.   Social History Social History  Substance Use Topics  . Smoking status: Current Every Day Smoker    Types: Cigarettes  . Smokeless tobacco: Never Used  . Alcohol Use: No   Review of Systems  Constitutional: Negative for fever. Cardiovascular: Negative for chest pain. Respiratory: Negative for shortness of breath. Gastrointestinal: Negative for abdominal pain, vomiting and diarrhea. Musculoskeletal: Negative for back pain. Skin: Positive for rash. Neurological: Negative for headaches, focal weakness or numbness. ____________________________________________  PHYSICAL EXAM:  VITAL SIGNS: ED Triage Vitals  Enc Vitals Group     BP 09/20/15 1241 147/96 mmHg     Pulse Rate 09/20/15 1241 83     Resp  09/20/15 1241 18     Temp 09/20/15 1241 98.2 F (36.8 C)     Temp Source 09/20/15 1241 Oral     SpO2 09/20/15 1241 98 %     Weight 09/20/15 1241 165 lb (74.844 kg)     Height 09/20/15 1241 5\' 1"  (1.549 m)     Head Cir --      Peak Flow --      Pain Score 09/20/15 1242 10     Pain Loc --      Pain Edu? --      Excl. in GC? --    Constitutional: Alert and oriented. Well appearing and in no distress. Head: Normocephalic and atraumatic. Hematological/Lymphatic/Immunological: No cervical lymphadenopathy. Cardiovascular: Normal rate, regular rhythm.  Respiratory: Normal respiratory effort.  Musculoskeletal: Nontender with normal range of motion in all extremities.  Neurologic:  Normal gait without ataxia. Normal speech and language. No gross focal neurologic deficits are appreciated. Skin:  Skin is warm, dry and intact. Patient with resolving papular rash to the right flank and is single dermatome distribution.  Psychiatric: Mood and affect are normal. Patient exhibits appropriate insight and judgment. ____________________________________________  INITIAL IMPRESSION / ASSESSMENT AND PLAN / ED COURSE  Patient with postherpetic pain following previous diagnosis of acute shingles. She has completed a history prescribed acyclovir and has run out of her prescribed pain medicines. She will be discharged with a prescription for Neurontin, prednisone taper pack, topical lidocaine jelly, and Percocet 20. She is advised to follow with her primary provider as scheduled. ____________________________________________  FINAL CLINICAL IMPRESSION(S) / ED DIAGNOSES  Final diagnoses:  Post herpetic neuralgia      Lissa HoardJenise V Bacon Wava Kildow, PA-C 09/20/15 1421  Governor Rooksebecca Lord, MD 09/20/15 1511

## 2015-11-15 ENCOUNTER — Emergency Department
Admission: EM | Admit: 2015-11-15 | Discharge: 2015-11-15 | Disposition: A | Discharge status: Home / Self Care | Payer: Medicaid Other | Attending: Emergency Medicine | Admitting: Emergency Medicine

## 2015-11-15 ENCOUNTER — Encounter: Payer: Self-pay | Admitting: Medical Oncology

## 2015-11-15 DIAGNOSIS — Z792 Long term (current) use of antibiotics: Secondary | ICD-10-CM | POA: Insufficient documentation

## 2015-11-15 DIAGNOSIS — R21 Rash and other nonspecific skin eruption: Secondary | ICD-10-CM | POA: Diagnosis present

## 2015-11-15 DIAGNOSIS — Z7952 Long term (current) use of systemic steroids: Secondary | ICD-10-CM | POA: Insufficient documentation

## 2015-11-15 DIAGNOSIS — B029 Zoster without complications: Secondary | ICD-10-CM | POA: Diagnosis not present

## 2015-11-15 DIAGNOSIS — Z8669 Personal history of other diseases of the nervous system and sense organs: Secondary | ICD-10-CM | POA: Diagnosis not present

## 2015-11-15 DIAGNOSIS — F17210 Nicotine dependence, cigarettes, uncomplicated: Secondary | ICD-10-CM | POA: Diagnosis not present

## 2015-11-15 DIAGNOSIS — Z79899 Other long term (current) drug therapy: Secondary | ICD-10-CM | POA: Insufficient documentation

## 2015-11-15 MED ORDER — OXYCODONE-ACETAMINOPHEN 5-325 MG PO TABS: 1.0000 | ORAL_TABLET | Freq: Four times a day (QID) | ORAL | Status: DC | PRN

## 2015-11-15 MED ORDER — LIDOCAINE 5 % EX PTCH: 1.0000 | MEDICATED_PATCH | Freq: Two times a day (BID) | CUTANEOUS | Status: DC

## 2015-11-15 MED ORDER — OXYCODONE-ACETAMINOPHEN 5-325 MG PO TABS
2.0000 | ORAL_TABLET | Freq: Once | ORAL | Status: AC
  Administered 2015-11-15: 2 via ORAL
  Filled 2015-11-15: qty 2

## 2015-11-15 MED ORDER — ACYCLOVIR 800 MG PO TABS: 800.0000 mg | ORAL_TABLET | Freq: Every day | ORAL | Status: DC

## 2015-11-15 NOTE — Discharge Instructions (Signed)
Shingles Shingles is an infection that causes a painful skin rash and fluid-filled blisters. Shingles is caused by the same virus that causes chickenpox. Shingles only develops in people who:  Have had chickenpox.  Have gotten the chickenpox vaccine. (This is rare.) The first symptoms of shingles may be itching, tingling, or pain in an area on your skin. A rash will follow in a few days or weeks. The rash is usually on one side of the body in a bandlike or beltlike pattern. Over time, the rash turns into fluid-filled blisters that break open, scab over, and dry up. Medicines may:  Help you manage pain.  Help you recover more quickly.  Help to prevent long-term problems. HOME CARE Medicines  Take medicines only as told by your doctor.  Apply an anti-itch or numbing cream to the affected area as told by your doctor. Blister and Rash Care  Take a cool bath or put cool compresses on the area of the rash or blisters as told by your doctor. This may help with pain and itching.  Keep your rash covered with a loose bandage (dressing). Wear loose-fitting clothing.  Keep your rash and blisters clean with mild soap and cool water or as told by your doctor.  Check your rash every day for signs of infection. These include redness, swelling, and pain that lasts or gets worse.  Do not pick your blisters.  Do not scratch your rash. General Instructions  Rest as told by your doctor.  Keep all follow-up visits as told by your doctor. This is important.  Until your blisters scab over, your infection can cause chickenpox in people who have never had it or been vaccinated against it. To prevent this from happening, avoid touching other people or being around other people, especially:  Babies.  Pregnant women.  Children who have eczema.  Elderly people who have transplants.  People who have chronic illnesses, such as leukemia or AIDS. GET HELP IF:  Your pain does not get better with  medicine.  Your pain does not get better after the rash heals.  Your rash looks infected. Signs of infection include:  Redness.  Swelling.  Pain that lasts or gets worse. GET HELP RIGHT AWAY IF:  The rash is on your face or nose.  You have pain in your face, pain around your eye area, or loss of feeling on one side of your face.  You have ear pain or you have ringing in your ear.  You have loss of taste.  Your condition gets worse.   This information is not intended to replace advice given to you by your health care provider. Make sure you discuss any questions you have with your health care provider.   Document Released: 11/26/2007 Document Revised: 06/30/2014 Document Reviewed: 03/21/2014 Elsevier Interactive Patient Education 2016 Elsevier Inc.  

## 2015-11-15 NOTE — ED Provider Notes (Signed)
[facility] Emergency Department Provider Note   ____________________________________________  Time seen: Approximately 6:58 PM  I have reviewed the triage vital signs and the nursing notes.   HISTORY  Chief Complaint Rash    HPI Gabriela Woodard is a 30 y.o. female patient complaining of a burning rash left low back radiating to the left lateral abdomen area. Onset was yesterday. Patient states burning pain has increased and she notices a satellite lesions on the bilateral elbows. No palliative measures taken for this complaint. Patient has a history of herpes zoster. Patient rates the pain as a 7/10.   Past Medical History  Diagnosis Date  . Seizures [facility])     Patient Active Problem List   Diagnosis Date Noted  . Leukocytosis, unspecified 03/17/2013  . Gestational diabetes mellitus, class A1 02/03/2013  . H/O gestational diabetes in prior pregnancy, currently pregnant 11/29/2012  . Rh negative state in antepartum period 10/26/2012  . Supervision of other normal pregnancy 10/25/2012  . Previous cesarean section 10/25/2012    Past Surgical History  Procedure Laterality Date  . Cesarean section      Current Outpatient Rx  Name  Route  Sig  Dispense  Refill  . acetaminophen-codeine (TYLENOL #3) 300-30 MG per tablet   Oral   Take 1-2 tablets by mouth every 6 (six) hours as needed.   20 tablet   0   . acyclovir (ZOVIRAX) 800 MG tablet   Oral   Take 1 tablet (800 mg total) by mouth 5 (five) times daily.   35 tablet   0   . amoxicillin (AMOXIL) 500 MG capsule   Oral   Take 1 capsule (500 mg total) by mouth 3 (three) times daily.   21 capsule   0   . clindamycin (CLEOCIN) 150 MG capsule   Oral   Take 1 capsule (150 mg total) by mouth every 6 (six) hours.   28 capsule   0   . diclofenac (VOLTAREN) 50 MG EC tablet   Oral   Take 1 tablet (50 mg total) by mouth 2 (two) times daily.   20 tablet   0   . gabapentin (NEURONTIN) 300 MG  capsule   Oral   Take 1 capsule (300 mg total) by mouth 3 (three) times daily.   30 capsule   1   . lidocaine (LIDODERM) 5 %   Transdermal   Place 1 patch onto the skin every 12 (twelve) hours. Remove & Discard patch within 12 hours or as directed by MD   10 patch   0   . Lidocaine 0.5 % GEL   Apply externally   Apply 1 application topically 2 times daily at 12 noon and 4 pm.   170 g   0   . oxyCODONE-acetaminophen (ROXICET) 5-325 MG tablet   Oral   Take 1 tablet by mouth every 6 (six) hours as needed for moderate pain or severe pain.   20 tablet   0   . penicillin v potassium (VEETID) 500 MG tablet   Oral   Take 1 tablet (500 mg total) by mouth 4 (four) times daily.   40 tablet   0   . predniSONE (STERAPRED UNI-PAK 21 TAB) 10 MG (21) TBPK tablet   Oral   Take 1 tablet (10 mg total) by mouth daily.   21 tablet   0   . prenatal vitamin w/FE, FA (PRENATAL 1 + 1) 27-1 MG TABS   Oral  Take 1 tablet by mouth daily at 12 noon.   30 each   12   . traMADol (ULTRAM) 50 MG tablet   Oral   Take 1 tablet (50 mg total) by mouth every 6 (six) hours as needed.   20 tablet   0     Allergies Review of patient's allergies indicates no known allergies.  Family History  Problem Relation Age of Onset  . Cancer Maternal Grandmother     breast  . Cancer Other     breast; maternal grandma's sister.    Social History Social History  Substance Use Topics  . Smoking status: Current Every Day Smoker    Types: Cigarettes  . Smokeless tobacco: Never Used  . Alcohol Use: No    Review of Systems Constitutional: No fever/chills Eyes: No visual changes.ENT: No sore throat. Cardiovascular: Denies chest pain. Respiratory: Denies shortness of breath. Gastrointestinal: No abdominal pain.  No nausea, no vomiting.  No diarrhea.  No constipation. Genitourinary: Negative for dysuria. Musculoskeletal: Negative for back pain. Skin: Positive for rash. Neurological: Negative for  headaches, focal weakness or numbness.    ____________________________________________   PHYSICAL EXAM:  VITAL SIGNS: ED Triage Vitals  Enc Vitals Group     BP 11/15/15 1838 138/79 mmHg     Pulse Rate 11/15/15 1838 84     Resp 11/15/15 1838 18     Temp 11/15/15 1838 98.5 F (36.9 C)     Temp Source 11/15/15 1838 Oral     SpO2 11/15/15 1838 100 %     Weight 11/15/15 1838 160 lb (72.576 kg)     Height 11/15/15 1838  (1.575 m)     Head Cir --      Peak Flow --      Pain Score 11/15/15 1838 7     Pain Loc --      Pain Edu? --      Excl. in GC? --     Constitutional: Alert and oriented. Well appearing and in no acute distress. Eyes: Conjunctivae are normal. PERRL. EOMI. Head: Atraumatic. Nose: No congestion/rhinnorhea. Mouth/Throat: Mucous membranes are moist.  Oropharynx non-erythematous. Neck: No stridor. No cervical spine tenderness to palpation. Hematological/Lymphatic/Immunilogical: No cervical lymphadenopathy. Cardiovascular: Normal rate, regular rhythm. Grossly normal heart sounds.  Good peripheral circulation. Respiratory: Normal respiratory effort.  No retractions. Lungs CTAB. Gastrointestinal: Soft and nontender. No distention. No abdominal bruits. No CVA tenderness. Musculoskeletal: No lower extremity tenderness nor edema.  No joint effusions. Neurologic:  Normal speech and language. No gross focal neurologic deficits are appreciated. No gait instability. Skin:  Skin is warm, dry and intact. No rash noted.Vesicle lesions on erythematous base left lateral back radiating to left lateral abdomen. Psychiatric: Mood and affect are normal. Speech and behavior are normal.  ____________________________________________   LABS (all labs ordered are listed, but only abnormal results are displayed)  Labs Reviewed - No data to  display ____________________________________________  EKG   ____________________________________________  RADIOLOGY   ____________________________________________   PROCEDURES  Procedure(s) performed: None  Critical Care performed: No  ____________________________________________   INITIAL IMPRESSION / ASSESSMENT AND PLAN / ED COURSE  Pertinent labs & imaging results that were available during my care of the patient were reviewed by me and considered in my medical decision making (see chart for details).  Herpes zoster. Patient given discharge care instructions. Patient given prescription for acyclovir and Percocets. Patient also given a prescription for Lidoderm patches if she finds pain is refractory to oral pain  medication. ____________________________________________   FINAL CLINICAL IMPRESSION(S) / ED DIAGNOSES  Final diagnoses:  Herpes zoster      NEW MEDICATIONS STARTED DURING THIS VISIT:  New Prescriptions   LIDOCAINE (LIDODERM) 5 %    Place 1 patch onto the skin every 12 (twelve) hours. Remove & Discard patch within 12 hours or as directed by MD     Note:  This document was prepared using Dragon voice recognition software and may include unintentional dictation errors.    Joni Reining, PA-C 11/15/15 1903  Joni Reining, PA-C 11/15/15 1610  Maurilio Lovely, MD 11/16/15 [phone]

## 2015-11-15 NOTE — ED Notes (Signed)
Burning rash to BILT elbows and to left side of back that began yesterday.

## 2015-11-15 NOTE — ED Notes (Signed)
See triage note  States she developed hive type rash to both elbows and left side of back  No resp distress noted

## 2015-12-08 ENCOUNTER — Emergency Department
Admission: EM | Admit: 2015-12-08 | Discharge: 2015-12-09 | Disposition: A | Discharge status: Home / Self Care | Payer: Medicaid Other | Attending: Emergency Medicine | Admitting: Emergency Medicine

## 2015-12-08 DIAGNOSIS — Z79899 Other long term (current) drug therapy: Secondary | ICD-10-CM | POA: Diagnosis not present

## 2015-12-08 DIAGNOSIS — Z8669 Personal history of other diseases of the nervous system and sense organs: Secondary | ICD-10-CM | POA: Insufficient documentation

## 2015-12-08 DIAGNOSIS — M5410 Radiculopathy, site unspecified: Secondary | ICD-10-CM

## 2015-12-08 DIAGNOSIS — M5416 Radiculopathy, lumbar region: Secondary | ICD-10-CM | POA: Insufficient documentation

## 2015-12-08 DIAGNOSIS — M549 Dorsalgia, unspecified: Secondary | ICD-10-CM | POA: Diagnosis present

## 2015-12-08 DIAGNOSIS — F17210 Nicotine dependence, cigarettes, uncomplicated: Secondary | ICD-10-CM | POA: Diagnosis not present

## 2015-12-08 LAB — URINALYSIS COMPLETE WITH MICROSCOPIC (ARMC ONLY)
BACTERIA UA: NONE SEEN
Bilirubin Urine: NEGATIVE
Glucose, UA: NEGATIVE mg/dL
Hgb urine dipstick: NEGATIVE
KETONES UR: NEGATIVE mg/dL
Leukocytes, UA: NEGATIVE
Nitrite: NEGATIVE
PH: 6 (ref 5.0–8.0)
PROTEIN: NEGATIVE mg/dL
Specific Gravity, Urine: 1.009 (ref 1.005–1.030)
Squamous Epithelial / LPF: NONE SEEN

## 2015-12-08 MED ORDER — OXYCODONE-ACETAMINOPHEN 5-325 MG PO TABS: 1.0000 | ORAL_TABLET | ORAL | Status: DC | PRN

## 2015-12-08 MED ORDER — DEXAMETHASONE SODIUM PHOSPHATE 10 MG/ML IJ SOLN
10.0000 mg | Freq: Once | INTRAMUSCULAR | Status: AC
  Administered 2015-12-08: 10 mg via INTRAMUSCULAR
  Filled 2015-12-08: qty 1

## 2015-12-08 MED ORDER — HYDROMORPHONE HCL 1 MG/ML IJ SOLN
1.0000 mg | Freq: Once | INTRAMUSCULAR | Status: AC
  Administered 2015-12-08: 1 mg via INTRAMUSCULAR
  Filled 2015-12-08: qty 1

## 2015-12-08 MED ORDER — METHYLPREDNISOLONE 4 MG PO TBPK: ORAL_TABLET | ORAL | Status: DC

## 2015-12-08 MED ORDER — METHOCARBAMOL 500 MG PO TABS
1000.0000 mg | ORAL_TABLET | Freq: Once | ORAL | Status: AC
  Administered 2015-12-08: 1000 mg via ORAL
  Filled 2015-12-08: qty 2

## 2015-12-08 MED ORDER — METHOCARBAMOL 750 MG PO TABS: 750.0000 mg | ORAL_TABLET | Freq: Four times a day (QID) | ORAL | Status: DC

## 2015-12-08 NOTE — ED Provider Notes (Signed)
[facility] Emergency Department Provider Note   ____________________________________________  Time seen: Approximately 11:13 PM  I have reviewed the triage vital signs and the nursing notes.   HISTORY  Chief Complaint Back Pain and Insect Bite    HPI Gabriela Woodard is a 30 y.o. female patient complaining of pain that starts in mid back and radiates down both legs. Patient denies any history of injury. Patient denies any bladder or bowel dysfunction. Patient stated onset of pain was 3 days ago. No palliative measures taken for this complaint. Patient rates the pain as a 7/10.   Past Medical History  Diagnosis Date  . Seizures [facility])     Patient Active Problem List   Diagnosis Date Noted  . Leukocytosis, unspecified 03/17/2013  . Gestational diabetes mellitus, class A1 02/03/2013  . H/O gestational diabetes in prior pregnancy, currently pregnant 11/29/2012  . Rh negative state in antepartum period 10/26/2012  . Supervision of other normal pregnancy 10/25/2012  . Previous cesarean section 10/25/2012    Past Surgical History  Procedure Laterality Date  . Cesarean section      Current Outpatient Rx  Name  Route  Sig  Dispense  Refill  . acetaminophen-codeine (TYLENOL #3) 300-30 MG per tablet   Oral   Take 1-2 tablets by mouth every 6 (six) hours as needed.   20 tablet   0   . acyclovir (ZOVIRAX) 800 MG tablet   Oral   Take 1 tablet (800 mg total) by mouth 5 (five) times daily.   35 tablet   0   . amoxicillin (AMOXIL) 500 MG capsule   Oral   Take 1 capsule (500 mg total) by mouth 3 (three) times daily.   21 capsule   0   . clindamycin (CLEOCIN) 150 MG capsule   Oral   Take 1 capsule (150 mg total) by mouth every 6 (six) hours.   28 capsule   0   . diclofenac (VOLTAREN) 50 MG EC tablet   Oral   Take 1 tablet (50 mg total) by mouth 2 (two) times daily.   20 tablet   0   . gabapentin (NEURONTIN) 300 MG capsule   Oral   Take 1  capsule (300 mg total) by mouth 3 (three) times daily.   30 capsule   1   . lidocaine (LIDODERM) 5 %   Transdermal   Place 1 patch onto the skin every 12 (twelve) hours. Remove & Discard patch within 12 hours or as directed by MD   10 patch   0   . Lidocaine 0.5 % GEL   Apply externally   Apply 1 application topically 2 times daily at 12 noon and 4 pm.   170 g   0   . methocarbamol (ROBAXIN-750) 750 MG tablet   Oral   Take 1 tablet (750 mg total) by mouth 4 (four) times daily.   20 tablet   0   . methylPREDNISolone (MEDROL DOSEPAK) 4 MG TBPK tablet      Take Tapered dose as directed   21 tablet   0   . oxyCODONE-acetaminophen (ROXICET) 5-325 MG tablet   Oral   Take 1 tablet by mouth every 6 (six) hours as needed for moderate pain or severe pain.   20 tablet   0   . oxyCODONE-acetaminophen (ROXICET) 5-325 MG tablet   Oral   Take 1 tablet by mouth every 4 (four) hours as needed for severe pain.  30 tablet   0   . penicillin v potassium (VEETID) 500 MG tablet   Oral   Take 1 tablet (500 mg total) by mouth 4 (four) times daily.   40 tablet   0   . predniSONE (STERAPRED UNI-PAK 21 TAB) 10 MG (21) TBPK tablet   Oral   Take 1 tablet (10 mg total) by mouth daily.   21 tablet   0   . prenatal vitamin w/FE, FA (PRENATAL 1 + 1) 27-1 MG TABS   Oral   Take 1 tablet by mouth daily at 12 noon.   30 each   12   . traMADol (ULTRAM) 50 MG tablet   Oral   Take 1 tablet (50 mg total) by mouth every 6 (six) hours as needed.   20 tablet   0     Allergies Review of patient's allergies indicates no known allergies.  Family History  Problem Relation Age of Onset  . Cancer Maternal Grandmother     breast  . Cancer Other     breast; maternal grandma's sister.    Social History Social History  Substance Use Topics  . Smoking status: Current Every Day Smoker    Types: Cigarettes  . Smokeless tobacco: Never Used  . Alcohol Use: No    Review of  Systems Constitutional: No fever/chills Eyes: No visual changes. ENT: No sore throat. Cardiovascular: Denies chest pain. Respiratory: Denies shortness of breath. Gastrointestinal: No abdominal pain.  No nausea, no vomiting.  No diarrhea.  No constipation. Genitourinary: Negative for dysuria. Musculoskeletal: Positive for back pain. Skin: Negative for rash. Neurological: Negative for headaches, focal weakness or numbness.   ____________________________________________   PHYSICAL EXAM:  VITAL SIGNS: ED Triage Vitals  Enc Vitals Group     BP 12/08/15 2136 138/95 mmHg     Pulse Rate 12/08/15 2136 89     Resp 12/08/15 2136 20     Temp 12/08/15 2136 98.2 F (36.8 C)     Temp Source 12/08/15 2136 Oral     SpO2 12/08/15 2136 98 %     Weight 12/08/15 2136 160 lb (72.576 kg)     Height 12/08/15 2136  (1.549 m)     Head Cir --      Peak Flow --      Pain Score 12/08/15 2138 7     Pain Loc --      Pain Edu? --      Excl. in GC? --     Constitutional: Alert and oriented. Moderate distress Eyes: Conjunctivae are normal. PERRL. EOMI. Head: Atraumatic. Nose: No congestion/rhinnorhea. Mouth/Throat: Mucous membranes are moist.  Oropharynx non-erythematous. Neck: No stridor.  No cervical spine tenderness to palpation. Hematological/Lymphatic/Immunilogical: No cervical lymphadenopathy. Cardiovascular: Normal rate, regular rhythm. Grossly normal heart sounds.  Good peripheral circulation. Respiratory: Normal respiratory effort.  No retractions. Lungs CTAB. Gastrointestinal: Soft and nontender. No distention. No abdominal bruits. No CVA tenderness. Musculoskeletal: No obvious spinal deformity. Patient has some moderate guarding palpation of L3-L5. Patient had positive straight leg test at 70 with the right lower extremity.  Neurologic:  Normal speech and language. No gross focal neurologic deficits are appreciated. No gait instability. Skin:  Skin is warm, dry and intact. No rash  noted. Psychiatric: Mood and affect are normal. Speech and behavior are normal.  ____________________________________________   LABS (all labs ordered are listed, but only abnormal results are displayed)  Labs Reviewed  URINALYSIS COMPLETEWITH MICROSCOPIC ([facility] ONLY) - Abnormal; Notable for the  following:    Color, Urine YELLOW (*)    APPearance CLEAR (*)    All other components within normal limits   ____________________________________________  EKG   ____________________________________________  RADIOLOGY   ____________________________________________   PROCEDURES  Procedure(s) performed: None  Critical Care performed: No  ____________________________________________   INITIAL IMPRESSION / ASSESSMENT AND PLAN / ED COURSE  Pertinent labs & imaging results that were available during my care of the patient were reviewed by me and considered in my medical decision making (see chart for details).  Radicular low back pain. Patient given discharge Instructions. Patient given a prescription for Medrol Dosepak, Percocet, and Robaxin. Patient [facility]. ____________________________________________   FINAL CLINICAL IMPRESSION(S) / ED DIAGNOSES  Final diagnoses:  Radicular low back pain      NEW MEDICATIONS STARTED DURING THIS VISIT:  New Prescriptions   METHOCARBAMOL (ROBAXIN-750) 750 MG TABLET    Take 1 tablet (750 mg total) by mouth 4 (four) times daily.   METHYLPREDNISOLONE (MEDROL DOSEPAK) 4 MG TBPK TABLET    Take Tapered dose as directed   OXYCODONE-ACETAMINOPHEN (ROXICET) 5-325 MG TABLET    Take 1 tablet by mouth every 4 (four) hours as needed for severe pain.     Note:  This document was prepared using Dragon voice recognition software and may include unintentional dictation errors.    Joni ReiningRonald K Tomoya Ringwald, PA-C 12/08/15 2330  Minna AntisKevin Paduchowski, MD 12/08/15 [phone]

## 2015-12-08 NOTE — Discharge Instructions (Signed)
Radicular Pain °Radicular pain in either the arm or leg is usually from a bulging or herniated disk in the spine. A piece of the herniated disk may press against the nerves as the nerves exit the spine. This causes pain which is felt at the tips of the nerves down the arm or leg. Other causes of radicular pain may include: °· Fractures. °· Heart disease. °· Cancer. °· An abnormal and usually degenerative state of the nervous system or nerves (neuropathy). °Diagnosis may require CT or MRI scanning to determine the primary cause.  °Nerves that start at the neck (nerve roots) may cause radicular pain in the outer shoulder and arm. It can spread down to the thumb and fingers. The symptoms vary depending on which nerve root has been affected. In most cases radicular pain improves with conservative treatment. Neck problems may require physical therapy, a neck collar, or cervical traction. Treatment may take many weeks, and surgery may be considered if the symptoms do not improve.  °Conservative treatment is also recommended for sciatica. Sciatica causes pain to radiate from the lower back or buttock area down the leg into the foot. Often there is a history of back problems. Most patients with sciatica are better after 2 to 4 weeks of rest and other supportive care. Short term bed rest can reduce the disk pressure considerably. Sitting, however, is not a good position since this increases the pressure on the disk. You should avoid bending, lifting, and all other activities which make the problem worse. Traction can be used in severe cases. Surgery is usually reserved for patients who do not improve within the first months of treatment. °Only take over-the-counter or prescription medicines for pain, discomfort, or fever as directed by your caregiver. Narcotics and muscle relaxants may help by relieving more severe pain and spasm and by providing mild sedation. Cold or massage can give significant relief. Spinal manipulation  is not recommended. It can increase the degree of disc protrusion. Epidural steroid injections are often effective treatment for radicular pain. These injections deliver medicine to the spinal nerve in the space between the protective covering of the spinal cord and back bones (vertebrae). Your caregiver can give you more information about steroid injections. These injections are most effective when given within two weeks of the onset of pain.  °You should see your caregiver for follow up care as recommended. A program for neck and back injury rehabilitation with stretching and strengthening exercises is an important part of management.  °SEEK IMMEDIATE MEDICAL CARE IF: °· You develop increased pain, weakness, or numbness in your arm or leg. °· You develop difficulty with bladder or bowel control. °· You develop abdominal pain. °  °This information is not intended to replace advice given to you by your health care provider. Make sure you discuss any questions you have with your health care provider. °  °Document Released: 07/17/2004 Document Revised: 06/30/2014 Document Reviewed: 01/03/2015 °Elsevier Interactive Patient Education ©2016 [employer] ° °

## 2015-12-08 NOTE — ED Notes (Signed)
Pt ambulatory to triage with slow but steady gait. Pt reports started about 3 days ago with pain to her mid back that radiates down both her legs. Pt  denies injury or urinary sx. Pt reports she has had shingles before but knows that pain is on one side. Pt concerned she may have been bitten by something. Pt does have a red raised area on her lower back to the right side. Pt also has several areas on her right outer thigh and on the left side of her neck. Pt in no acute distress.

## 2015-12-09 NOTE — ED Notes (Signed)

## 2016-01-13 ENCOUNTER — Encounter: Payer: Self-pay | Admitting: Emergency Medicine

## 2016-01-13 ENCOUNTER — Emergency Department: Payer: Medicaid Other

## 2016-01-13 ENCOUNTER — Inpatient Hospital Stay
Admission: EM | Admit: 2016-01-13 | Discharge: 2016-01-15 | DRG: 872 | Disposition: A | Discharge status: Home / Self Care | Payer: Medicaid Other | Attending: Internal Medicine | Admitting: Internal Medicine

## 2016-01-13 DIAGNOSIS — F17210 Nicotine dependence, cigarettes, uncomplicated: Secondary | ICD-10-CM | POA: Diagnosis present

## 2016-01-13 DIAGNOSIS — N10 Acute tubulo-interstitial nephritis: Secondary | ICD-10-CM | POA: Diagnosis present

## 2016-01-13 DIAGNOSIS — N390 Urinary tract infection, site not specified: Secondary | ICD-10-CM | POA: Diagnosis present

## 2016-01-13 DIAGNOSIS — A419 Sepsis, unspecified organism: Secondary | ICD-10-CM | POA: Diagnosis present

## 2016-01-13 DIAGNOSIS — L0291 Cutaneous abscess, unspecified: Secondary | ICD-10-CM

## 2016-01-13 DIAGNOSIS — A4151 Sepsis due to Escherichia coli [E. coli]: Principal | ICD-10-CM | POA: Diagnosis present

## 2016-01-13 DIAGNOSIS — M545 Low back pain, unspecified: Secondary | ICD-10-CM

## 2016-01-13 DIAGNOSIS — E876 Hypokalemia: Secondary | ICD-10-CM | POA: Diagnosis present

## 2016-01-13 LAB — COMPREHENSIVE METABOLIC PANEL
ALK PHOS: 77 U/L (ref 38–126)
ALT: 11 U/L — ABNORMAL LOW (ref 14–54)
ANION GAP: 9 (ref 5–15)
AST: 13 U/L — ABNORMAL LOW (ref 15–41)
Albumin: 4.4 g/dL (ref 3.5–5.0)
BILIRUBIN TOTAL: 0.9 mg/dL (ref 0.3–1.2)
BUN: 12 mg/dL (ref 6–20)
CALCIUM: 9.3 mg/dL (ref 8.9–10.3)
CO2: 26 mmol/L (ref 22–32)
Chloride: 106 mmol/L (ref 101–111)
Creatinine, Ser: 1.07 mg/dL — ABNORMAL HIGH (ref 0.44–1.00)
GFR calc Af Amer: >60 mL/min (ref >60)
GFR calc non Af Amer: >60 mL/min (ref >60)
Glucose, Bld: 102 mg/dL — ABNORMAL HIGH (ref 65–99)
Potassium: 3.6 mmol/L (ref 3.5–5.1)
Sodium: 141 mmol/L (ref 135–145)
TOTAL PROTEIN: 8.2 g/dL — AB (ref 6.5–8.1)

## 2016-01-13 LAB — URINALYSIS COMPLETE WITH MICROSCOPIC (ARMC ONLY)
Bilirubin Urine: NEGATIVE
Glucose, UA: NEGATIVE mg/dL
KETONES UR: NEGATIVE mg/dL
Nitrite: NEGATIVE
Protein, ur: 30 mg/dL — AB
SPECIFIC GRAVITY, URINE: 1.017 (ref 1.005–1.030)
pH: 5 (ref 5.0–8.0)

## 2016-01-13 LAB — CBC
HCT: 43.8 % (ref 35.0–47.0)
HEMOGLOBIN: 14.9 g/dL (ref 12.0–16.0)
MCH: 30.3 pg (ref 26.0–34.0)
MCHC: 34.1 g/dL (ref 32.0–36.0)
MCV: 89.1 fL (ref 80.0–100.0)
Platelets: 273 10*3/uL (ref 150–440)
RBC: 4.91 MIL/uL (ref 3.80–5.20)
RDW: 13.9 % (ref 11.5–14.5)
WBC: 24.4 10*3/uL — AB (ref 3.6–11.0)

## 2016-01-13 LAB — TROPONIN I: Troponin I: <0.03 ng/mL (ref <0.03)

## 2016-01-13 LAB — POCT PREGNANCY, URINE: PREG TEST UR: NEGATIVE

## 2016-01-13 LAB — LACTIC ACID, PLASMA: LACTIC ACID, VENOUS: 1.5 mmol/L (ref 0.5–1.9)

## 2016-01-13 LAB — LIPASE, BLOOD: Lipase: 16 U/L (ref 11–51)

## 2016-01-13 MED ORDER — ONDANSETRON HCL 4 MG/2ML IJ SOLN
INTRAMUSCULAR | Status: AC
  Administered 2016-01-13: 4 mg via INTRAVENOUS
  Filled 2016-01-13: qty 2

## 2016-01-13 MED ORDER — MORPHINE SULFATE (PF) 4 MG/ML IV SOLN
4.0000 mg | Freq: Once | INTRAVENOUS | Status: AC
  Administered 2016-01-13: 4 mg via INTRAVENOUS

## 2016-01-13 MED ORDER — SODIUM CHLORIDE 0.9 % IV BOLUS (SEPSIS)
1000.0000 mL | Freq: Once | INTRAVENOUS | Status: AC
  Administered 2016-01-13: 1000 mL via INTRAVENOUS

## 2016-01-13 MED ORDER — SODIUM CHLORIDE 0.9 % IV BOLUS (SEPSIS): 1000.0000 mL | Freq: Once | INTRAVENOUS | Status: DC

## 2016-01-13 MED ORDER — SODIUM CHLORIDE 0.9 % IV BOLUS (SEPSIS)
250.0000 mL | Freq: Once | INTRAVENOUS | Status: AC
  Administered 2016-01-13: 250 mL via INTRAVENOUS

## 2016-01-13 MED ORDER — ACETAMINOPHEN 325 MG PO TABS
650.0000 mg | ORAL_TABLET | Freq: Once | ORAL | Status: AC
  Administered 2016-01-13: 650 mg via ORAL
  Filled 2016-01-13: qty 2

## 2016-01-13 MED ORDER — SODIUM CHLORIDE 0.9 % IV BOLUS (SEPSIS): 250.0000 mL | Freq: Once | INTRAVENOUS | Status: DC

## 2016-01-13 MED ORDER — ONDANSETRON HCL 4 MG/2ML IJ SOLN
4.0000 mg | Freq: Once | INTRAMUSCULAR | Status: AC
  Administered 2016-01-13: 4 mg via INTRAVENOUS

## 2016-01-13 MED ORDER — DEXTROSE 5 % IV SOLN
2.0000 g | Freq: Once | INTRAVENOUS | Status: AC
  Administered 2016-01-13: 2 g via INTRAVENOUS
  Filled 2016-01-13: qty 2

## 2016-01-13 MED ORDER — DEXTROSE 5 % IV SOLN: 2.0000 g | Freq: Once | INTRAVENOUS | Status: DC

## 2016-01-13 MED ORDER — MORPHINE SULFATE (PF) 4 MG/ML IV SOLN
INTRAVENOUS | Status: AC
  Administered 2016-01-13: 4 mg via INTRAVENOUS
  Filled 2016-01-13: qty 1

## 2016-01-13 NOTE — ED Provider Notes (Signed)
[facility] Emergency Department Provider Note   ____________________________________________  Time seen: Approximately 915 PM  I have reviewed the triage vital signs and the nursing notes.   HISTORY  Chief Complaint Back Pain    HPI Gabriela Woodard is a 30 y.o. female with a history of seizures who is presenting with low back pain over the past month. She says it has been intermittent but has worsened throughout the day today. Says it is worse with movement and radiates down her right leg all the way to the medial right ankle. Denies any history of IV drug use. Denies any dysuria or frequency. Denies any abdominal pain.Says the pain is across her low back.   Past Medical History:  Diagnosis Date  . Seizures [facility])     Patient Active Problem List   Diagnosis Date Noted  . Leukocytosis, unspecified 03/17/2013  . Gestational diabetes mellitus, class A1 02/03/2013  . H/O gestational diabetes in prior pregnancy, currently pregnant 11/29/2012  . Rh negative state in antepartum period 10/26/2012  . Supervision of other normal pregnancy 10/25/2012  . Previous cesarean section 10/25/2012    Past Surgical History:  Procedure Laterality Date  . CESAREAN SECTION      Current Outpatient Rx  . Order #: [id number] Class: Print  . Order #: [id number] Class: Print  . Order #: [id number] Class: Print  . Order #: [id number] Class: Print  . Order #: [id number] Class: Print  . Order #: [id number] Class: Print  . Order #: [id number] Class: Print  . Order #: [id number] Class: Print  . Order #: [id number] Class: Print  . Order #: [id number] Class: Print  . Order #: [id number] Class: Print  . Order #: [id number] Class: Print  . Order #: [id number] Class: Print  . Order #: [id number] Class: Print  . Order #: [id number] Class: Normal  . Order #: [id number] Class: Print    Allergies Review of patient's allergies indicates no known allergies.  Family History  Problem Relation Age of Onset    . Cancer Maternal Grandmother     breast  . Cancer Other     breast; maternal grandma's sister.    Social History Social History  Substance Use Topics  . Smoking status: Current Every Day Smoker    Types: Cigarettes  . Smokeless tobacco: Never Used  . Alcohol use No    Review of Systems Constitutional: No fever/chills Eyes: No visual changes. ENT: No sore throat. Cardiovascular: Denies chest pain. Respiratory: Denies shortness of breath. Gastrointestinal: No abdominal pain.  No nausea, no vomiting.  No diarrhea.  No constipation. Genitourinary: Negative for dysuria. Musculoskeletal:  Skin: Negative for rash. Neurological: Negative for headaches, focal weakness or numbness.  10-point ROS otherwise negative.  ____________________________________________   PHYSICAL EXAM:  VITAL SIGNS: ED Triage Vitals [01/13/16 1859]  Enc Vitals Group     BP 106/71     Pulse Rate (!) 123     Resp 20     Temp (!) 102.5 F (39.2 C)     Temp Source Oral     SpO2 98 %     Weight 155 lb (70.3 kg)     Height  (1.575 m)     Head Circumference      Peak Flow      Pain Score 6     Pain Loc      Pain Edu?      Excl. in GC?     Constitutional: Alert and oriented.  Eyes: Conjunctivae are normal. PERRL. EOMI. Head: Atraumatic. Nose: No congestion/rhinnorhea. Mouth/Throat: Mucous  membranes are moist.   Neck: No stridor.   Cardiovascular: Tachycardic, regular rhythm. Grossly normal heart sounds.   Respiratory: Normal respiratory effort.  No retractions. Lungs CTAB. Gastrointestinal: Soft and nontender. No distention  Musculoskeletal: No lower extremity tenderness nor edema.  No joint effusions. No saddle anesthesia. Negative straight leg raise bilaterally. Tenderness across the low lumbar region. Has midline tenderness as well as bilateral tenderness laterally. No deformity or step-off. Neurologic:  Normal speech and language. No gross focal neurologic deficits are appreciated. No  gait instability. Skin:  Skin is warm, dry and intact. No rash noted. Psychiatric: Mood and affect are normal. Speech and behavior are normal.  ____________________________________________   LABS (all labs ordered are listed, but only abnormal results are displayed)  Labs Reviewed  COMPREHENSIVE METABOLIC PANEL - Abnormal; Notable for the following:       Result Value   Glucose, Bld 102 (*)    Creatinine, Ser 1.07 (*)    Total Protein 8.2 (*)    AST 13 (*)    ALT 11 (*)    All other components within normal limits  CBC - Abnormal; Notable for the following:    WBC 24.4 (*)    All other components within normal limits  URINALYSIS COMPLETEWITH MICROSCOPIC ([facility] ONLY) - Abnormal; Notable for the following:    Color, Urine YELLOW (*)    APPearance CLEAR (*)    Hgb urine dipstick 1+ (*)    Protein, ur 30 (*)    Leukocytes, UA 2+ (*)    Bacteria, UA RARE (*)    Squamous Epithelial / LPF 0-5 (*)    All other components within normal limits  CULTURE, BLOOD (ROUTINE X 2)  CULTURE, BLOOD (ROUTINE X 2)  URINE CULTURE  LIPASE, BLOOD  LACTIC ACID, PLASMA  TROPONIN I  POC URINE PREG, ED  POCT PREGNANCY, URINE   ____________________________________________  EKG  ED ECG REPORT I, Schaevitz,  Teena Irani, the attending physician, personally viewed and interpreted this ECG.   Date: 01/13/2016  EKG Time: 2122  Rate: 1:15  Rhythm: sinus tachycardia  Axis: Normal  Intervals:none  ST&T Change: No ST segment elevation or depression. No abnormal T-wave inversion.  ____________________________________________  RADIOLOGY  Pending MRI of the lumbar spine with contrast. ____________________________________________   PROCEDURES    Procedures   ____________________________________________   INITIAL IMPRESSION / ASSESSMENT AND PLAN / ED COURSE  Pertinent labs & imaging results that were available during my care of the patient were reviewed by me and considered in my medical  decision making (see chart for details).  ----------------------------------------- 12:12 AM on 01/14/2016 -----------------------------------------  Patient still febrile after Tylenol. Sepsis protocol was called. Patient with few risk factors but very difficult to justify this is all caused by UTI with pain radiating down to the medial ankle. Still possible that the sepsis from UTI. However, must rule out epidural abscess. Signed out to Dr. Lenard Lance. ____________________________________________   FINAL CLINICAL IMPRESSION(S) / ED DIAGNOSES  Final diagnoses:  Low back pain   UTI. Sepsis.   NEW MEDICATIONS STARTED DURING THIS VISIT:  New Prescriptions   No medications on file     Note:  This document was prepared using Dragon voice recognition software and may include unintentional dictation errors.    Myrna Blazer, MD 01/14/16 [phone]

## 2016-01-13 NOTE — ED Triage Notes (Signed)
Pt reports lower back pain that started last month and has progressively gotten worse radiates down right leg. Pt reports strong odor to urine. Fever 102.5 in triage

## 2016-01-13 NOTE — Progress Notes (Signed)
Pharmacy Antibiotic Note  Gabriela Woodard is a 30 y.o. female admitted on 01/13/2016 with UTI.  Pharmacy has been consulted for ceftriaxone dosing.  Plan: Ceftriaxone 1 gm IV Q24H  Height: 5\' 2"  (157.5 cm) Weight: 155 lb (70.3 kg) IBW/kg (Calculated) : 50.1  Temp (24hrs), Avg:102.5 F (39.2 C), Min:102.5 F (39.2 C), Max:102.5 F (39.2 C)   Recent Labs Lab 01/13/16 1915 01/13/16 2108  WBC 24.4*  --   CREATININE 1.07*  --   LATICACIDVEN  --  1.5    Estimated Creatinine Clearance: 71.9 mL/min (by C-G formula based on SCr of 1.07 mg/dL).    No Known Allergies  Thank you for allowing pharmacy to be a part of this patient's care.  Carola Frost, Pharm.D., BCPS Clinical Pharmacist 01/13/2016 10:51 PM

## 2016-01-14 ENCOUNTER — Emergency Department: Payer: Medicaid Other

## 2016-01-14 ENCOUNTER — Encounter: Payer: Self-pay | Admitting: Internal Medicine

## 2016-01-14 DIAGNOSIS — M545 Low back pain: Secondary | ICD-10-CM | POA: Diagnosis present

## 2016-01-14 DIAGNOSIS — A4151 Sepsis due to Escherichia coli [E. coli]: Secondary | ICD-10-CM | POA: Diagnosis present

## 2016-01-14 DIAGNOSIS — F17210 Nicotine dependence, cigarettes, uncomplicated: Secondary | ICD-10-CM | POA: Diagnosis present

## 2016-01-14 DIAGNOSIS — N10 Acute tubulo-interstitial nephritis: Secondary | ICD-10-CM | POA: Diagnosis present

## 2016-01-14 DIAGNOSIS — E876 Hypokalemia: Secondary | ICD-10-CM | POA: Diagnosis present

## 2016-01-14 DIAGNOSIS — A419 Sepsis, unspecified organism: Secondary | ICD-10-CM | POA: Diagnosis present

## 2016-01-14 DIAGNOSIS — N390 Urinary tract infection, site not specified: Secondary | ICD-10-CM | POA: Diagnosis present

## 2016-01-14 LAB — CBC
HCT: 38 % (ref 35.0–47.0)
HEMOGLOBIN: 13.1 g/dL (ref 12.0–16.0)
MCH: 30.7 pg (ref 26.0–34.0)
MCHC: 34.6 g/dL (ref 32.0–36.0)
MCV: 88.7 fL (ref 80.0–100.0)
Platelets: 235 10*3/uL (ref 150–440)
RBC: 4.28 MIL/uL (ref 3.80–5.20)
RDW: 13.6 % (ref 11.5–14.5)
WBC: 20.6 10*3/uL — ABNORMAL HIGH (ref 3.6–11.0)

## 2016-01-14 LAB — LACTIC ACID, PLASMA
Lactic Acid, Venous: 1.1 mmol/L (ref 0.5–1.9)
Lactic Acid, Venous: 0.7 mmol/L (ref 0.5–1.9)

## 2016-01-14 LAB — BASIC METABOLIC PANEL
Anion gap: 5 (ref 5–15)
BUN: 11 mg/dL (ref 6–20)
CHLORIDE: 110 mmol/L (ref 101–111)
CO2: 22 mmol/L (ref 22–32)
Calcium: 8.3 mg/dL — ABNORMAL LOW (ref 8.9–10.3)
Creatinine, Ser: 1.06 mg/dL — ABNORMAL HIGH (ref 0.44–1.00)
GFR calc Af Amer: >60 mL/min (ref >60)
GFR calc non Af Amer: >60 mL/min (ref >60)
GLUCOSE: 158 mg/dL — AB (ref 65–99)
POTASSIUM: 3.1 mmol/L — AB (ref 3.5–5.1)
Sodium: 137 mmol/L (ref 135–145)

## 2016-01-14 LAB — PROCALCITONIN: Procalcitonin: 0.11 ng/mL

## 2016-01-14 MED ORDER — SENNOSIDES-DOCUSATE SODIUM 8.6-50 MG PO TABS: 1.0000 | ORAL_TABLET | Freq: Every evening | ORAL | Status: DC | PRN

## 2016-01-14 MED ORDER — ACETAMINOPHEN 325 MG PO TABS
650.0000 mg | ORAL_TABLET | Freq: Four times a day (QID) | ORAL | Status: DC | PRN
Start: 2016-01-14 — End: 2016-01-15
  Administered 2016-01-14: 650 mg via ORAL
  Filled 2016-01-14: qty 2

## 2016-01-14 MED ORDER — CEFTRIAXONE SODIUM 1 G IJ SOLR
1.0000 g | INTRAMUSCULAR | Status: DC
Start: 2016-01-14 — End: 2016-01-14
  Filled 2016-01-14: qty 10

## 2016-01-14 MED ORDER — ONDANSETRON HCL 4 MG/2ML IJ SOLN
4.0000 mg | Freq: Four times a day (QID) | INTRAMUSCULAR | Status: DC | PRN
  Administered 2016-01-14: 4 mg via INTRAVENOUS
  Filled 2016-01-14: qty 2

## 2016-01-14 MED ORDER — HYDROCODONE-ACETAMINOPHEN 5-325 MG PO TABS
1.0000 | ORAL_TABLET | ORAL | Status: DC | PRN
  Administered 2016-01-14 – 2016-01-15 (×4): 2 via ORAL
  Filled 2016-01-14 (×4): qty 2

## 2016-01-14 MED ORDER — NICOTINE 21 MG/24HR TD PT24
21.0000 mg | MEDICATED_PATCH | Freq: Every day | TRANSDERMAL | Status: DC
  Administered 2016-01-14: 21 mg via TRANSDERMAL
  Filled 2016-01-14 (×2): qty 1

## 2016-01-14 MED ORDER — SODIUM CHLORIDE 0.9 % IV SOLN
INTRAVENOUS | Status: DC
  Administered 2016-01-14 (×3): via INTRAVENOUS

## 2016-01-14 MED ORDER — HYDROMORPHONE HCL 1 MG/ML IJ SOLN: 1.0000 mg | INTRAMUSCULAR | Status: DC | PRN

## 2016-01-14 MED ORDER — OXYCODONE-ACETAMINOPHEN 5-325 MG PO TABS
1.0000 | ORAL_TABLET | ORAL | Status: DC | PRN
  Administered 2016-01-14 – 2016-01-15 (×4): 1 via ORAL
  Filled 2016-01-14 (×4): qty 1

## 2016-01-14 MED ORDER — CEFTRIAXONE SODIUM 2 G IJ SOLR
2.0000 g | INTRAMUSCULAR | Status: DC
  Administered 2016-01-14: 2 g via INTRAVENOUS
  Filled 2016-01-14 (×2): qty 2

## 2016-01-14 MED ORDER — GADOBENATE DIMEGLUMINE 529 MG/ML IV SOLN
15.0000 mL | Freq: Once | INTRAVENOUS | Status: AC | PRN
  Administered 2016-01-14: 14 mL via INTRAVENOUS

## 2016-01-14 MED ORDER — POTASSIUM CHLORIDE 20 MEQ/15ML (10%) PO SOLN
40.0000 meq | Freq: Once | ORAL | Status: AC
  Administered 2016-01-14: 40 meq via ORAL
  Filled 2016-01-14: qty 30

## 2016-01-14 MED ORDER — ONDANSETRON HCL 4 MG PO TABS
4.0000 mg | ORAL_TABLET | Freq: Four times a day (QID) | ORAL | Status: DC | PRN
  Administered 2016-01-14: 4 mg via ORAL
  Filled 2016-01-14: qty 1

## 2016-01-14 MED ORDER — ACETAMINOPHEN 650 MG RE SUPP: 650.0000 mg | Freq: Four times a day (QID) | RECTAL | Status: DC | PRN

## 2016-01-14 MED ORDER — ENOXAPARIN SODIUM 40 MG/0.4ML ~~LOC~~ SOLN
40.0000 mg | Freq: Every day | SUBCUTANEOUS | Status: DC
  Administered 2016-01-14 (×2): 40 mg via SUBCUTANEOUS
  Filled 2016-01-14 (×2): qty 0.4

## 2016-01-14 MED ORDER — DEXTROSE 5 % IV SOLN: 2.0000 g | Freq: Once | INTRAVENOUS | Status: DC

## 2016-01-14 NOTE — Progress Notes (Signed)
Pharmacy Antibiotic Note  Gabriela Woodard is a 30 y.o. female admitted on 01/13/2016 with UTI/acute pyelonephritis.  Pharmacy has been consulted for ceftriaxone dosing. Pt received 2g IV x1 in the ED at 7/23 2148.  Plan: Ceftriaxone 2 gm IV Q24H  Height: 5\' 2"  (157.5 cm) Weight: 183 lb 12.8 oz (83.4 kg) IBW/kg (Calculated) : 50.1  Temp (24hrs), Avg:101.2 F (38.4 C), Min:98.9 F (37.2 C), Max:103.1 F (39.5 C)   Recent Labs Lab 01/13/16 1915 01/13/16 2108 01/14/16 0500 01/14/16 0812  WBC 24.4*  --  20.6*  --   CREATININE 1.07*  --  1.06*  --   LATICACIDVEN  --  1.5 1.1 0.7    Estimated Creatinine Clearance: 79.1 mL/min (by C-G formula based on SCr of 1.06 mg/dL).    No Known Allergies  BCx x2 sent UCx sent   Thank you for allowing pharmacy to be a part of this patient's care.  Marty Heck, Pharm.D., BCPS Clinical Pharmacist 01/14/2016 8:56 AM

## 2016-01-14 NOTE — Progress Notes (Signed)
Patient requested once more to go outside. Inquired of reason why, stated she smokes. Educated given, paged MD to obtain nicotine patch, awaiting response.

## 2016-01-14 NOTE — ED Provider Notes (Signed)
-----------------------------------------   2:52 AM on 01/14/2016 -----------------------------------------  Patient MRI is normal. Continues to be mildly tachycardic around 110 bpm. Has received 1 g of IV Rocephin. We will admit to the hospital for urinary tract infection meeting sepsis criteria with possible pyelonephritis.   Minna Antis, MD 01/14/16 [phone]

## 2016-01-14 NOTE — H&P (Signed)
[facility] [place] at [facility]   PATIENT NAME: Gabriela Woodard    MR#:  [id number]  DATE OF BIRTH:  1986/02/06  DATE OF ADMISSION:  01/13/2016  PRIMARY CARE PHYSICIAN: No PCP Per Patient   REQUESTING/REFERRING PHYSICIAN:   CHIEF COMPLAINT:   Chief Complaint  Patient presents with  . Back Pain    HISTORY OF PRESENT ILLNESS: Britny Pribnow  is a 30 y.o. female with a known history of Seizure in the past presented to the emergency room with low back pain and fever of one day duration. Patient has fever and chills and also complains of burning sensation when he passes urine. She has low back pain which is aching in nature 7 out of 10 on a scale of 1 to 10. No complaints of chest pain. No history of shortness of breath. No history of bowel or bladder incontinence. Patient was evaluated in the emergency room was found to be having urinary tract infection with elevated WBC count. She also complains of tenderness in the right costovertebral angle area. No history of any recent travel or sick contacts at home. Patient was given IV Rocephin antibiotic and hospitalist service was consulted for further care of the patient. Patient had elevated WBC count and is tachycardic and febrile and code sepsis was called in the emergency room. She received IV fluids based on sepsis protocol.  PAST MEDICAL HISTORY:   Past Medical History:  Diagnosis Date  . Seizures (HCC)     PAST SURGICAL HISTORY: Past Surgical History:  Procedure Laterality Date  . CESAREAN SECTION      SOCIAL HISTORY:  Social History  Substance Use Topics  . Smoking status: Current Every Day Smoker    Types: Cigarettes  . Smokeless tobacco: Never Used  . Alcohol use Yes     Comment: occasionally    FAMILY HISTORY:  Family History  Problem Relation Age of Onset  . Cancer Maternal Grandmother     breast  . Cancer Other     breast; maternal grandma's sister.  . Diabetes Mellitus II Paternal Grandmother      DRUG ALLERGIES: No Known Allergies  REVIEW OF SYSTEMS:   CONSTITUTIONAL: Has fever, weakness.  EYES: No blurred or double vision.  EARS, NOSE, AND THROAT: No tinnitus or ear pain.  RESPIRATORY: No cough, shortness of breath, wheezing or hemoptysis.  CARDIOVASCULAR: No chest pain, orthopnea, edema.  GASTROINTESTINAL: No nausea, vomiting, diarrhea or abdominal pain.  GENITOURINARY: Has dysuria, no hematuria.  ENDOCRINE: No polyuria, nocturia,  HEMATOLOGY: No anemia, easy bruising or bleeding SKIN: No rash or lesion. MUSCULOSKELETAL: No joint pain or arthritis.  Has low back pain. NEUROLOGIC: No tingling, numbness, weakness.  PSYCHIATRY: No anxiety or depression.   MEDICATIONS AT HOME:  Prior to Admission medications   Not on File      PHYSICAL EXAMINATION:   VITAL SIGNS: Blood pressure 111/68, pulse (!) 104, temperature (!) 101.7 F (38.7 C), resp. rate (!) 38, height 5\' 2"  (1.575 m), weight 70.3 kg (155 lb), last menstrual period 12/15/2015, SpO2 93 %, unknown if currently breastfeeding.  GENERAL:  30 y.o.-year-old patient lying in the bed in mild distress.  EYES: Pupils equal, round, reactive to light and accommodation. No scleral icterus. Extraocular muscles intact.  HEENT: Head atraumatic, normocephalic. Oropharynx and nasopharynx clear.  NECK:  Supple, no jugular venous distention. No thyroid enlargement, no tenderness.  LUNGS: Normal breath sounds bilaterally, no wheezing, rales,rhonchi or crepitation. No use of accessory muscles of  respiration.  CARDIOVASCULAR: S1, S2 normal. No murmurs, rubs, or gallops.  ABDOMEN: Soft, nontender, nondistended. Bowel sounds present. No organomegaly or mass.  Tenderness in right cost vertebral angle area. EXTREMITIES: No pedal edema, cyanosis, or clubbing.  NEUROLOGIC: Cranial nerves II through XII are intact. Muscle strength 5/5 in all extremities. Sensation intact. Gait not checked.  PSYCHIATRIC: The patient is alert and oriented x  3.  SKIN: No obvious rash, lesion, or ulcer.   LABORATORY PANEL:   CBC  Recent Labs Lab 01/13/16 1915  WBC 24.4*  HGB 14.9  HCT 43.8  PLT 273  MCV 89.1  MCH 30.3  MCHC 34.1  RDW 13.9   ------------------------------------------------------------------------------------------------------------------  Chemistries   Recent Labs Lab 01/13/16 1915  NA 141  K 3.6  CL 106  CO2 26  GLUCOSE 102*  BUN 12  CREATININE 1.07*  CALCIUM 9.3  AST 13*  ALT 11*  ALKPHOS 77  BILITOT 0.9   ------------------------------------------------------------------------------------------------------------------ estimated creatinine clearance is 71.9 mL/min (by C-G formula based on SCr of 1.07 mg/dL). ------------------------------------------------------------------------------------------------------------------ No results for input(s): TSH, T4TOTAL, T3FREE, THYROIDAB in the last 72 hours.  Invalid input(s): FREET3   Coagulation profile No results for input(s): INR, PROTIME in the last 168 hours. ------------------------------------------------------------------------------------------------------------------- No results for input(s): DDIMER in the last 72 hours. -------------------------------------------------------------------------------------------------------------------  Cardiac Enzymes  Recent Labs Lab 01/13/16 1915  TROPONINI <0.03   ------------------------------------------------------------------------------------------------------------------ Invalid input(s): POCBNP  ---------------------------------------------------------------------------------------------------------------  Urinalysis    Component Value Date/Time   COLORURINE YELLOW (A) 01/13/2016 1915   APPEARANCEUR CLEAR (A) 01/13/2016 1915   LABSPEC 1.017 01/13/2016 1915   PHURINE 5.0 01/13/2016 1915   GLUCOSEU NEGATIVE 01/13/2016 1915   HGBUR 1+ (A) 01/13/2016 1915   BILIRUBINUR NEGATIVE 01/13/2016  1915   KETONESUR NEGATIVE 01/13/2016 1915   PROTEINUR 30 (A) 01/13/2016 1915   UROBILINOGEN 0.2 10/25/2012 1450   NITRITE NEGATIVE 01/13/2016 1915   LEUKOCYTESUR 2+ (A) 01/13/2016 1915     RADIOLOGY: Mr Lumbar Spine W Wo Contrast  Result Date: 01/14/2016 CLINICAL DATA:  Initial evaluation for low back pain for 1 month. EXAM: MRI LUMBAR SPINE WITHOUT AND WITH CONTRAST TECHNIQUE: Multiplanar and multiecho pulse sequences of the lumbar spine were obtained without and with intravenous contrast. CONTRAST:  14mL MULTIHANCE GADOBENATE DIMEGLUMINE 529 MG/ML IV SOLN COMPARISON:  None. FINDINGS: Segmentation: Normal segmentation. Lowest well-formed disc is presumed to be the L5-S1 level. Alignment: Vertebral bodies normally aligned with preservation of the normal lumbar lordosis. No listhesis or subluxation. Vertebrae: Vertebral body heights well preserved. No acute or chronic fracture. Signal intensity within the vertebral body bone marrow is normal. No focal osseous lesions. No marrow edema. No abnormal enhancement. Conus medullaris: Extends to the L1 level and appears normal. Paraspinal and other soft tissues: Paraspinous soft tissues within normal limits. No acute abnormality. Disc levels: No significant degenerative changes seen within the lumbar spine. No significant disc bulging or focal disc herniation. No canal or foraminal stenosis. No evidence for neural impingement. IMPRESSION: Negative MRI of the lumbar spine. No acute abnormality identified. No findings to explain patient's back pain Electronically Signed   By: Rise Mu M.D.   On: 01/14/2016 01:59  Dg Chest Port 1 View  Result Date: 01/13/2016 CLINICAL DATA:  30 year old female with back pain EXAM: PORTABLE CHEST 1 VIEW COMPARISON:  None. FINDINGS: The heart size and mediastinal contours are within normal limits. Both lungs are clear. The visualized skeletal structures are unremarkable. IMPRESSION: No active disease. Electronically  Signed   By: Burtis Junes  Radparvar M.D.   On: 01/13/2016 22:47   EKG: Orders placed or performed in visit on 07/22/07  . EKG 12-Lead    IMPRESSION AND PLAN: 30 year old female patient presented to the emergency room with fever or chills and low back pain. Admitting diagnosis 1. Sepsis 2. Acute pyelonephritis 3. Low back pain secondary to urinary tract infection 4. Tachycardia Treatment plan Admit patient to medical floor IV fluid hydration IV Rocephin antibiotic Pain management DVT prophylaxis with subcutaneous Lovenox 40 MG daily Follow-up cultures Supportive care.  All the records are reviewed and case discussed with ED provider. Management plans discussed with the patient, family and they are in agreement.  CODE STATUS:FULL    Code Status Orders        Start     Ordered   01/14/16 0258  Full code  Continuous     01/14/16 0259    Code Status History    Date Active Date Inactive Code Status Order ID Comments User Context   This patient has a current code status but no historical code status.       TOTAL TIME TAKING CARE OF THIS PATIENT: 54 minutes.    Ihor Austin M.D on 01/14/2016 at 3:14 AM  Between 7am to 6pm - Pager - [phone]  After 6pm go to [email or web address] - password [email or web address] [facility]  [place] [facility]  Office  [phone]  CC: Primary care physician; No PCP Per Patient

## 2016-01-14 NOTE — Progress Notes (Signed)
Patient requested multiple medications for pain, then requested if she was able to go outside

## 2016-01-14 NOTE — Progress Notes (Signed)
[facility] - [place] at [facility]   PATIENT NAME: Gabriela Woodard    MR#:  [id number]  DATE OF BIRTH:  21-Mar-1986  SUBJECTIVE:   Patient here with fever and chills, bilateral flank pain and found to have acute pyelonephritis. Still having fevers and chills along with back pain.  REVIEW OF SYSTEMS:    Review of Systems  Constitutional: Positive for fever and malaise/fatigue. Negative for chills.  HENT: Negative.  Negative for ear discharge, ear pain, hearing loss, nosebleeds and sore throat.   Eyes: Negative.  Negative for blurred vision and pain.  Respiratory: Negative.  Negative for cough, hemoptysis, shortness of breath and wheezing.   Cardiovascular: Negative.  Negative for chest pain, palpitations and leg swelling.  Gastrointestinal: Negative.  Negative for abdominal pain, blood in stool, diarrhea, nausea and vomiting.  Genitourinary: Positive for dysuria and flank pain.  Musculoskeletal: Positive for myalgias. Negative for back pain.  Skin: Negative.   Neurological: Positive for weakness. Negative for dizziness, tremors, speech change, focal weakness, seizures and headaches.  Endo/Heme/Allergies: Negative.  Does not bruise/bleed easily.  Psychiatric/Behavioral: Negative.  Negative for depression, hallucinations and suicidal ideas.    Tolerating Diet: yes      DRUG ALLERGIES:  No Known Allergies  VITALS:  Blood pressure (!) 88/48, pulse 80, temperature 98.9 F (37.2 C), temperature source Oral, resp. rate 16, height  (1.575 m), weight 83.4 kg (183 lb 12.8 oz), last menstrual period 12/15/2015, SpO2 97 %, unknown if currently breastfeeding.  PHYSICAL EXAMINATION:   Physical Exam  Constitutional: She is oriented to person, place, and time and well-developed, well-nourished, and in no distress. No distress.  HENT:  Head: Normocephalic.  Eyes: No scleral icterus.  Neck: Normal range of motion. Neck supple. No JVD present. No tracheal deviation  present.  Cardiovascular: Normal rate, regular rhythm and normal heart sounds.  Exam reveals no gallop and no friction rub.   No murmur heard. Pulmonary/Chest: Effort normal and breath sounds normal. No respiratory distress. She has no wheezes. She has no rales. She exhibits no tenderness.  Abdominal: Soft. Bowel sounds are normal. She exhibits no distension and no mass. There is no tenderness. There is no rebound and no guarding.  Bilateral flank pain  Musculoskeletal: Normal range of motion. She exhibits no edema.  Neurological: She is alert and oriented to person, place, and time.  Skin: Skin is warm. No rash noted. No erythema.  Psychiatric: Affect and judgment normal.      LABORATORY PANEL:   CBC  Recent Labs Lab 01/14/16 0500  WBC 20.6*  HGB 13.1  HCT 38.0  PLT 235   ------------------------------------------------------------------------------------------------------------------  Chemistries   Recent Labs Lab 01/13/16 1915 01/14/16 0500  NA 141 137  K 3.6 3.1*  CL 106 110  CO2 26 22  GLUCOSE 102* 158*  BUN 12 11  CREATININE 1.07* 1.06*  CALCIUM 9.3 8.3*  AST 13*  --   ALT 11*  --   ALKPHOS 77  --   BILITOT 0.9  --    ------------------------------------------------------------------------------------------------------------------  Cardiac Enzymes  Recent Labs Lab 01/13/16 1915  TROPONINI <0.03   ------------------------------------------------------------------------------------------------------------------  RADIOLOGY:  Mr Lumbar Spine W Wo Contrast  Result Date: 01/14/2016 CLINICAL DATA:  Initial evaluation for low back pain for 1 month. EXAM: MRI LUMBAR SPINE WITHOUT AND WITH CONTRAST TECHNIQUE: Multiplanar and multiecho pulse sequences of the lumbar spine were obtained without and with intravenous contrast. CONTRAST:  14mL MULTIHANCE GADOBENATE DIMEGLUMINE 529 MG/ML IV SOLN COMPARISON:  None. FINDINGS: Segmentation: Normal segmentation. Lowest  well-formed disc is presumed to be the L5-S1 level. Alignment: Vertebral bodies normally aligned with preservation of the normal lumbar lordosis. No listhesis or subluxation. Vertebrae: Vertebral body heights well preserved. No acute or chronic fracture. Signal intensity within the vertebral body bone marrow is normal. No focal osseous lesions. No marrow edema. No abnormal enhancement. Conus medullaris: Extends to the L1 level and appears normal. Paraspinal and other soft tissues: Paraspinous soft tissues within normal limits. No acute abnormality. Disc levels: No significant degenerative changes seen within the lumbar spine. No significant disc bulging or focal disc herniation. No canal or foraminal stenosis. No evidence for neural impingement. IMPRESSION: Negative MRI of the lumbar spine. No acute abnormality identified. No findings to explain patient's back pain Electronically Signed   By: Rise Mu M.D.   On: 01/14/2016 01:59  Dg Chest Port 1 View  Result Date: 01/13/2016 CLINICAL DATA:  30 year old female with back pain EXAM: PORTABLE CHEST 1 VIEW COMPARISON:  None. FINDINGS: The heart size and mediastinal contours are within normal limits. Both lungs are clear. The visualized skeletal structures are unremarkable. IMPRESSION: No active disease. Electronically Signed   By: Elgie Collard M.D.   On: 01/13/2016 22:47    ASSESSMENT AND PLAN:   30 year old female who is admitted with sepsis due to acute pyelonephritis   1. Sepsis: Patient presents with tachycardia, feverand leukocytosis. Sepsis is from acute pyelonephritis. Continue IV Rocephin and IV fluids.  2. Acute pyelonephritis: Continue IV Rocephin and follow up on blood and urine culture. CT scan order to evaluate for abscesses given significant amount of back pain. MRI lumbar spine initially done in the ER which is negative for discitis.   3.Hypokalemia: Replete and recheck in a.m.     Management plans discussed with  the patient and she is in agreement.  CODE STATUS: full  TOTAL TIME TAKING CARE OF THIS PATIENT: 33 minutes.     POSSIBLE D/C 2 days, DEPENDING ON CLINICAL CONDITION.   Cloris Flippo M.D on 01/14/2016 at 9:40 AM  Between 7am to 6pm - Pager - [phone] After 6pm go to [email or web address] - Social research officer, government  [facility]  Office  [phone]  CC: Primary care physician; No PCP Per Patient  Note: This dictation was prepared with Dragon dictation along with smaller phrase technology. Any transcriptional errors that result from this process are unintentional.

## 2016-01-15 LAB — BASIC METABOLIC PANEL
ANION GAP: 7 (ref 5–15)
BUN: 7 mg/dL (ref 6–20)
CALCIUM: 8.2 mg/dL — AB (ref 8.9–10.3)
CO2: 26 mmol/L (ref 22–32)
Chloride: 107 mmol/L (ref 101–111)
Creatinine, Ser: 0.74 mg/dL (ref 0.44–1.00)
GFR calc Af Amer: >60 mL/min (ref >60)
GLUCOSE: 83 mg/dL (ref 65–99)
Potassium: 4 mmol/L (ref 3.5–5.1)
Sodium: 140 mmol/L (ref 135–145)

## 2016-01-15 LAB — CBC
HCT: 37.6 % (ref 35.0–47.0)
HEMOGLOBIN: 12.9 g/dL (ref 12.0–16.0)
MCH: 30.8 pg (ref 26.0–34.0)
MCHC: 34.4 g/dL (ref 32.0–36.0)
MCV: 89.5 fL (ref 80.0–100.0)
Platelets: 223 10*3/uL (ref 150–440)
RBC: 4.2 MIL/uL (ref 3.80–5.20)
RDW: 13.5 % (ref 11.5–14.5)
WBC: 15.2 10*3/uL — ABNORMAL HIGH (ref 3.6–11.0)

## 2016-01-15 MED ORDER — CEFUROXIME AXETIL 500 MG PO TABS: 500.0000 mg | ORAL_TABLET | Freq: Two times a day (BID) | ORAL | 0 refills | Status: DC

## 2016-01-15 MED ORDER — HYDROCODONE-ACETAMINOPHEN 5-325 MG PO TABS: 1.0000 | ORAL_TABLET | ORAL | 0 refills | Status: DC | PRN

## 2016-01-15 MED ORDER — NICOTINE 21 MG/24HR TD PT24: 21.0000 mg | MEDICATED_PATCH | Freq: Every day | TRANSDERMAL | 0 refills | Status: DC

## 2016-01-15 MED ORDER — ONDANSETRON HCL 4 MG PO TABS: 4.0000 mg | ORAL_TABLET | Freq: Four times a day (QID) | ORAL | 0 refills | Status: DC | PRN

## 2016-01-15 NOTE — Discharge Instructions (Signed)
Asymptomatic Bacteriuria, Female Asymptomatic bacteriuria is the presence of a large number of bacteria in your urine without the usual symptoms of burning or frequent urination. The following conditions increase the risk of asymptomatic bacteriuria:  Diabetes mellitus.  Advanced age.  Pregnancy in the first trimester.  Kidney stones.  Kidney transplants.  Leaky kidney tube valve in young children (reflux). Treatment for this condition is not needed in most people and can lead to other problems such as too much yeast and growth of resistant bacteria. However, some people, such as pregnant women, do need treatment to prevent kidney infection. Asymptomatic bacteriuria in pregnancy is also associated with fetal growth restriction, premature labor, and newborn death. HOME CARE INSTRUCTIONS Monitor your condition for any changes. The following actions may help to relieve any discomfort you are feeling:  Drink enough water and fluids to keep your urine clear or pale yellow. Go to the bathroom more often to keep your bladder empty.  Keep the area around your vagina and rectum clean. Wipe yourself from front to back after urinating. SEEK IMMEDIATE MEDICAL CARE IF:  You develop signs of an infection such as:  Burning with urination.  Frequency of voiding.  Back pain.  Fever.  You have blood in the urine.  You develop a fever. MAKE SURE YOU:  Understand these instructions.  Will watch your condition.  Will get help right away if you are not doing well or get worse.   This information is not intended to replace advice given to you by your health care provider. Make sure you discuss any questions you have with your health care provider.   Document Released: 06/09/2005 Document Revised: 06/30/2014 Document Reviewed: 11/29/2012 Elsevier Interactive Patient Education 2016 Elsevier Inc.   Antibiotic Medicine Antibiotic medicines are used to treat infections caused by bacteria.  They work by injuring or killing the bacteria that is making you sick. HOW IS AN ANTIBIOTIC CHOSEN? An antibiotic is chosen based on many factors. To help your health care provider choose one for you, tell your health care provider if:  You have any allergies.  You are pregnant or plan to get pregnant.  You are breastfeeding.  You are taking any medicines. These include over-the-counter medicines, prescription medicines, and herbal remedies.  You have a medical condition or problem you have not already discussed. Your health care provider will also consider:  How often the medicine has to be taken.  Common side effects of the medicine.  The cost of the medicine.  The taste of the medicine. If you have questions about why an antibiotic was chosen, make sure to ask. FOR HOW LONG SHOULD I TAKE MY ANTIBIOTIC? Continue to take your antibiotic for as long as told by your health care provider. Do not stop taking it when you feel better. If you stop taking it too soon:  You may start to feel sick again.  Your infection may become harder to treat.  Complications may develop. WHAT IF I MISS A DOSE? Try not to miss any doses of medicine. If you miss a dose, take it as soon as possible. However, if it is almost time for the next dose:  If you are taking 2 doses per day, take the missed dose and the next dose 5 to 6 hours apart.  If you are taking 3 or more doses per day, take the missed dose and the next dose 2 to 4 hours apart, then go back to the normal schedule. If you cannot make  up a missed dose, take the next scheduled dose on time. Then take the missed dose after you have taken all the doses as recommended by your health care provider, as if you had one more dose left. DO ANTIBIOTICS AFFECT BIRTH CONTROL? Birth control pills may not work while you are on antibiotics. If you are taking birth control pills, continue taking them as usual and use a second form of birth control, such as  a condom, to avoid unwanted pregnancy. Continue using the second form of birth control until you are finished with your current 1 month cycle of birth control pills. OTHER INFORMATION  If there is any medicine left over, throw it away.  Never take someone else's antibiotics.  Never take leftover antibiotics. SEEK MEDICAL CARE IF:  You get worse.  You do not feel better within a few days of starting the antibiotic medicine.  You vomit.  White patches appear in your mouth.  You have new joint pain that begins after starting the antibiotic.  You have new muscle aches that begin after starting the antibiotic.  You had a fever before starting the antibiotic and it returns.  You have any symptoms of an allergic reaction, such as an itchy rash. If this happens, stop taking the antibiotic. SEEK IMMEDIATE MEDICAL CARE IF:  Your urine turns dark or becomes blood-colored.  Your skin turns yellow.  You bruise or bleed easily.  You have severe diarrhea and abdominal cramps.  You have a severe headache.  You have signs of a severe allergic reaction, such as:  Trouble breathing.  Wheezing.  Swelling of the lips, tongue, or face.  Fainting.  Blisters on the skin or in the mouth. If you have signs of a severe allergic reaction, stop taking the antibiotic right away.   This information is not intended to replace advice given to you by your health care provider. Make sure you discuss any questions you have with your health care provider.   Document Released: 02/20/2004 Document Revised: 02/28/2015 Document Reviewed: 10/25/2014 Elsevier Interactive Patient Education [employer]

## 2016-01-15 NOTE — Progress Notes (Signed)
Patient discharged to home with husband. Rx's sent to [facility] pharmacy. Reviewed d/c instructions with patient, will sched. F/u with PCP.

## 2016-01-15 NOTE — Discharge Summary (Addendum)
[facility] - [place] [facility]   PATIENT NAME: Gabriela Woodard    MR#:  [id number]  DATE OF BIRTH:  28-Jun-1985  DATE OF ADMISSION:  01/13/2016 ADMITTING PHYSICIAN: Ihor Austin, MD  DATE OF DISCHARGE: 01/15/2016  PRIMARY CARE PHYSICIAN: No PCP Per Patient    ADMISSION DIAGNOSIS:  Low back pain [M54.5] UTI (lower urinary tract infection) [N39.0] Sepsis, due to unspecified organism (HCC) [A41.9]  DISCHARGE DIAGNOSIS:  Active Problems:   Sepsis (HCC)   UTI (lower urinary tract infection)   SECONDARY DIAGNOSIS:   Past Medical History:  Diagnosis Date  . Seizures [facility])     HOSPITAL COURSE:   30 year old female who is admitted with sepsis due to acute pyelonephritis   1. Sepsis: Patient presented with tachycardia, feverand leukocytosis. Sepsis is from acute Escherichia coli pyelonephritis. She was started on IV Rocephin and IV fluids. Urine culture is positive for Escherichia coli.  2. Acute pyelonephritis: She was started on IV Rocephin for acute pyelonephritis. Urine culture is growing out Escherichia coli. Patient will be discharged with Ceftin. Patient may have fevers for 2-3 days after discharge. It was discussed if she continues to have fevers greater than for 4 days from admission then she should return to the emergency room for further evaluation.   3.Hypokalemia: Repleted  4. Chronic back pain: Patient underwent MRI which had no evidence of discitis or lumbar stenosis.     DISCHARGE CONDITIONS AND DIET:   Stable for discharge on regular diet  CONSULTS OBTAINED:    DRUG ALLERGIES:  No Known Allergies  DISCHARGE MEDICATIONS:   Current Discharge Medication List    START taking these medications   Details  cefUROXime (CEFTIN) 500 MG tablet Take 1 tablet (500 mg total) by mouth 2 (two) times daily with a meal. Qty: 22 tablet, Refills: 0    HYDROcodone-acetaminophen (NORCO/VICODIN) 5-325 MG tablet Take 1-2 tablets by mouth every 4  (four) hours as needed for moderate pain. Qty: 20 tablet, Refills: 0    nicotine (NICODERM CQ - DOSED IN MG/24 HOURS) 21 mg/24hr patch Place 1 patch (21 mg total) onto the skin daily at 8 pm. Qty: 28 patch, Refills: 0    ondansetron (ZOFRAN) 4 MG tablet Take 1 tablet (4 mg total) by mouth every 6 (six) hours as needed for nausea. Qty: 20 tablet, Refills: 0              Today   CHIEF COMPLAINT:   Doing fairly well this morning. She has a headache. She smokes one pack a day and would like to go smoke.   VITAL SIGNS:  Blood pressure 118/82, pulse 91, temperature (!) 100.7 F (38.2 C), temperature source Oral, resp. rate 18, height 5\' 2"  (1.575 m), weight 83.4 kg (183 lb 12.8 oz), last menstrual period 12/15/2015, SpO2 95 %, unknown if currently breastfeeding.   REVIEW OF SYSTEMS:  Review of Systems  Constitutional: Negative.  Negative for chills, fever and malaise/fatigue.  HENT: Negative for ear discharge, ear pain, hearing loss, nosebleeds and sore throat.   Eyes: Negative.  Negative for blurred vision and pain.  Respiratory: Negative.  Negative for cough, hemoptysis, shortness of breath and wheezing.   Cardiovascular: Negative.  Negative for chest pain, palpitations and leg swelling.  Gastrointestinal: Negative.  Negative for abdominal pain, blood in stool, diarrhea, nausea and vomiting.  Genitourinary: Negative.  Negative for dysuria.  Musculoskeletal: Positive for back pain (chronic).  Skin: Negative.   Neurological: Positive for headaches. Negative for dizziness,  tremors, speech change, focal weakness and seizures.  Endo/Heme/Allergies: Negative.  Does not bruise/bleed easily.  Psychiatric/Behavioral: Negative.  Negative for depression, hallucinations and suicidal ideas.     PHYSICAL EXAMINATION:  GENERAL:  30 y.o.-year-old patient lying in the bed with no acute distress.  NECK:  Supple, no jugular venous distention. No thyroid enlargement, no tenderness.  LUNGS:  Normal breath sounds bilaterally, no wheezing, rales,rhonchi  No use of accessory muscles of respiration.  CARDIOVASCULAR: S1, S2 normal. No murmurs, rubs, or gallops.  ABDOMEN: Soft, non-tender, non-distended. Bowel sounds present. No organomegaly or mass.  EXTREMITIES: No pedal edema, cyanosis, or clubbing.  PSYCHIATRIC: The patient is alert and oriented x 3.  SKIN: No obvious rash, lesion, or ulcer.   DATA REVIEW:   CBC  Recent Labs Lab 01/15/16 0327  WBC 15.2*  HGB 12.9  HCT 37.6  PLT 223    Chemistries   Recent Labs Lab 01/13/16 1915  01/15/16 0327  NA 141  < > 140  K 3.6  < > 4.0  CL 106  < > 107  CO2 26  < > 26  GLUCOSE 102*  < > 83  BUN 12  < > 7  CREATININE 1.07*  < > 0.74  CALCIUM 9.3  < > 8.2*  AST 13*  --   --   ALT 11*  --   --   ALKPHOS 77  --   --   BILITOT 0.9  --   --   < > = values in this interval not displayed.  Cardiac Enzymes  Recent Labs Lab 01/13/16 1915  TROPONINI <0.03    Microbiology Results  @[email or web address] @  RADIOLOGY:  Mr Lumbar Spine W Wo Contrast  Result Date: 01/14/2016 CLINICAL DATA:  Initial evaluation for low back pain for 1 month. EXAM: MRI LUMBAR SPINE WITHOUT AND WITH CONTRAST TECHNIQUE: Multiplanar and multiecho pulse sequences of the lumbar spine were obtained without and with intravenous contrast. CONTRAST:  34mL MULTIHANCE GADOBENATE DIMEGLUMINE 529 MG/ML IV SOLN COMPARISON:  None. FINDINGS: Segmentation: Normal segmentation. Lowest well-formed disc is presumed to be the L5-S1 level. Alignment: Vertebral bodies normally aligned with preservation of the normal lumbar lordosis. No listhesis or subluxation. Vertebrae: Vertebral body heights well preserved. No acute or chronic fracture. Signal intensity within the vertebral body bone marrow is normal. No focal osseous lesions. No marrow edema. No abnormal enhancement. Conus medullaris: Extends to the L1 level and appears normal. Paraspinal and other soft tissues: Paraspinous  soft tissues within normal limits. No acute abnormality. Disc levels: No significant degenerative changes seen within the lumbar spine. No significant disc bulging or focal disc herniation. No canal or foraminal stenosis. No evidence for neural impingement. IMPRESSION: Negative MRI of the lumbar spine. No acute abnormality identified. No findings to explain patient's back pain Electronically Signed   By: Rise Mu M.D.   On: 01/14/2016 01:59  Dg Chest Port 1 View  Result Date: 01/13/2016 CLINICAL DATA:  30 year old female with back pain EXAM: PORTABLE CHEST 1 VIEW COMPARISON:  None. FINDINGS: The heart size and mediastinal contours are within normal limits. Both lungs are clear. The visualized skeletal structures are unremarkable. IMPRESSION: No active disease. Electronically Signed   By: Elgie Collard M.D.   On: 01/13/2016 22:47     Management plans discussed with the patient and she is in agreement. Stable for discharge home  Patient should follow up with PCP 1 week She stated she would find a primary care physician CODE STATUS:  Code Status Orders        Start     Ordered   01/14/16 0258  Full code  Continuous     01/14/16 0259    Code Status History    Date Active Date Inactive Code Status Order ID Comments User Context   This patient has a current code status but no historical code status.      TOTAL TIME TAKING CARE OF THIS PATIENT: 35 minutes.    Note: This dictation was prepared with Dragon dictation along with smaller phrase technology. Any transcriptional errors that result from this process are unintentional.  Tahiry Spicer M.D on 01/15/2016 at 10:16 AM  Between 7am to 6pm - Pager - [phone] After 6pm go to [email or web address] - Social research officer, government  [facility]  Office  [phone]  CC: Primary care physician; No PCP Per Patient

## 2016-01-16 LAB — URINE CULTURE

## 2016-02-06 LAB — CULTURE, BLOOD (ROUTINE X 2)
Culture: NO GROWTH
Culture: NO GROWTH

## 2016-03-29 ENCOUNTER — Encounter: Payer: Self-pay | Admitting: Emergency Medicine

## 2016-03-29 ENCOUNTER — Emergency Department
Admission: EM | Admit: 2016-03-29 | Discharge: 2016-03-29 | Disposition: A | Discharge status: Home / Self Care | Payer: Medicaid Other | Attending: Emergency Medicine | Admitting: Emergency Medicine

## 2016-03-29 DIAGNOSIS — Y939 Activity, unspecified: Secondary | ICD-10-CM | POA: Insufficient documentation

## 2016-03-29 DIAGNOSIS — Y999 Unspecified external cause status: Secondary | ICD-10-CM | POA: Diagnosis not present

## 2016-03-29 DIAGNOSIS — F17210 Nicotine dependence, cigarettes, uncomplicated: Secondary | ICD-10-CM | POA: Insufficient documentation

## 2016-03-29 DIAGNOSIS — Z791 Long term (current) use of non-steroidal anti-inflammatories (NSAID): Secondary | ICD-10-CM | POA: Diagnosis not present

## 2016-03-29 DIAGNOSIS — W57XXXA Bitten or stung by nonvenomous insect and other nonvenomous arthropods, initial encounter: Secondary | ICD-10-CM | POA: Insufficient documentation

## 2016-03-29 DIAGNOSIS — Y929 Unspecified place or not applicable: Secondary | ICD-10-CM | POA: Insufficient documentation

## 2016-03-29 DIAGNOSIS — S1096XA Insect bite of unspecified part of neck, initial encounter: Secondary | ICD-10-CM

## 2016-03-29 DIAGNOSIS — R21 Rash and other nonspecific skin eruption: Secondary | ICD-10-CM | POA: Diagnosis present

## 2016-03-29 MED ORDER — TRAMADOL HCL 50 MG PO TABS
50.0000 mg | ORAL_TABLET | Freq: Once | ORAL | Status: AC
  Administered 2016-03-29: 50 mg via ORAL
  Filled 2016-03-29: qty 1

## 2016-03-29 MED ORDER — HYDROXYZINE HCL 50 MG PO TABS: 50.0000 mg | ORAL_TABLET | Freq: Three times a day (TID) | ORAL | 0 refills | Status: DC | PRN

## 2016-03-29 MED ORDER — TRAMADOL HCL 50 MG PO TABS: 50.0000 mg | ORAL_TABLET | Freq: Four times a day (QID) | ORAL | 0 refills | Status: DC | PRN

## 2016-03-29 MED ORDER — METHYLPREDNISOLONE 4 MG PO TBPK: ORAL_TABLET | ORAL | 0 refills | Status: DC

## 2016-03-29 MED ORDER — HYDROXYZINE HCL 50 MG PO TABS
50.0000 mg | ORAL_TABLET | Freq: Once | ORAL | Status: AC
  Administered 2016-03-29: 50 mg via ORAL
  Filled 2016-03-29: qty 1

## 2016-03-29 MED ORDER — PREDNISONE 20 MG PO TABS
60.0000 mg | ORAL_TABLET | Freq: Once | ORAL | Status: AC
  Administered 2016-03-29: 60 mg via ORAL
  Filled 2016-03-29: qty 3

## 2016-03-29 NOTE — ED Triage Notes (Signed)
Patient developed a painful rash to the back of her neck that started this morning. Patient reports that she has taken benadryl with no improvement.

## 2016-03-29 NOTE — ED Provider Notes (Signed)
[facility] Emergency Department Provider Note   ____________________________________________   None    (approximate)  I have reviewed the triage vital signs and the nursing notes.   HISTORY  Chief Complaint Rash    HPI Gabriela Woodard is a 30 y.o. female patient complaining of a painful rash to the back or neck that started this morning. Patient states she is taking Benadryl with no improvement. Patient described the pain as a burning sensation. Patient rates pain as 8/10. No other palliative measures taken for this complaint.   Past Medical History:  Diagnosis Date  . Seizures [facility](HCC)     Patient Active Problem List   Diagnosis Date Noted  . Sepsis (HCC) 01/14/2016  . UTI (lower urinary tract infection) 01/14/2016  . Leukocytosis, unspecified 03/17/2013  . Gestational diabetes mellitus, class A1 02/03/2013  . H/O gestational diabetes in prior pregnancy, currently pregnant 11/29/2012  . Rh negative state in antepartum period 10/26/2012  . Supervision of other normal pregnancy 10/25/2012  . Previous cesarean section 10/25/2012    Past Surgical History:  Procedure Laterality Date  . CESAREAN SECTION      Prior to Admission medications   Medication Sig Start Date End Date Taking? Authorizing Provider  cefUROXime (CEFTIN) 500 MG tablet Take 1 tablet (500 mg total) by mouth 2 (two) times daily with a meal. 01/15/16   Adrian SaranSital Mody, MD  HYDROcodone-acetaminophen (NORCO/VICODIN) 5-325 MG tablet Take 1-2 tablets by mouth every 4 (four) hours as needed for moderate pain. 01/15/16   Adrian SaranSital Mody, MD  hydrOXYzine (ATARAX/VISTARIL) 50 MG tablet Take 1 tablet (50 mg total) by mouth 3 (three) times daily as needed for itching. 03/29/16   Joni Reiningonald K Sven Pinheiro, PA-C  methylPREDNISolone (MEDROL DOSEPAK) 4 MG TBPK tablet Take Tapered dose as directed 03/29/16   Joni Reiningonald K Kaedence Connelly, PA-C  nicotine (NICODERM CQ - DOSED IN MG/24 HOURS) 21 mg/24hr patch Place 1 patch (21 mg total)  onto the skin daily at 8 pm. 01/15/16   Adrian SaranSital Mody, MD  ondansetron (ZOFRAN) 4 MG tablet Take 1 tablet (4 mg total) by mouth every 6 (six) hours as needed for nausea. 01/15/16   Adrian SaranSital Mody, MD  traMADol (ULTRAM) 50 MG tablet Take 1 tablet (50 mg total) by mouth every 6 (six) hours as needed. 03/29/16 03/29/17  Joni Reiningonald K Nishika Parkhurst, PA-C    Allergies Review of patient's allergies indicates no known allergies.  Family History  Problem Relation Age of Onset  . Cancer Maternal Grandmother     breast  . Cancer Other     breast; maternal grandma's sister.  . Diabetes Mellitus II Paternal Grandmother     Social History Social History  Substance Use Topics  . Smoking status: Current Every Day Smoker    Types: Cigarettes  . Smokeless tobacco: Never Used  . Alcohol use No    Review of Systems Constitutional: No fever/chills Eyes: No visual changes. ENT: No sore throat. Cardiovascular: Denies chest pain. Respiratory: Denies shortness of breath. Gastrointestinal: No abdominal pain.  No nausea, no vomiting.  No diarrhea.  No constipation. Genitourinary: Negative for dysuria. Musculoskeletal: Negative for back pain. Skin: Positive for rash. Neurological: Negative for headaches, focal weakness or numbness.    ____________________________________________   PHYSICAL EXAM:  VITAL SIGNS: ED Triage Vitals  Enc Vitals Group     BP 03/29/16 2048 120/80     Pulse Rate 03/29/16 2047 (!) 103     Resp 03/29/16 2047 18     Temp  03/29/16 2047 98.4 F (36.9 C)     Temp Source 03/29/16 2047 Oral     SpO2 03/29/16 2047 98 %     Weight 03/29/16 2047 160 lb (72.6 kg)     Height 03/29/16 2047 5\' 2"  (1.575 m)     Head Circumference --      Peak Flow --      Pain Score 03/29/16 2047 8     Pain Loc --      Pain Edu? --      Excl. in GC? --     Constitutional: Alert and oriented. Well appearing and in no acute distress. Eyes: Conjunctivae are normal. PERRL. EOMI. Head: Atraumatic. Nose: No  congestion/rhinnorhea. Mouth/Throat: Mucous membranes are moist.  Oropharynx non-erythematous. Neck: No stridor.  No cervical spine tenderness to palpation. Hematological/Lymphatic/Immunilogical: No cervical lymphadenopathy. Cardiovascular: Normal rate, regular rhythm. Grossly normal heart sounds.  Good peripheral circulation. Respiratory: Normal respiratory effort.  No retractions. Lungs CTAB. Gastrointestinal: Soft and nontender. No distention. No abdominal bruits. No CVA tenderness. Musculoskeletal: No lower extremity tenderness nor edema.  No joint effusions. Neurologic:  Normal speech and language. No gross focal neurologic deficits are appreciated. No gait instability. Skin:  Skin is warm, dry and intact. Erythematous macular lesions posterior neck with puncture marks. Psychiatric: Mood and affect are normal. Speech and behavior are normal.  ____________________________________________   LABS (all labs ordered are listed, but only abnormal results are displayed)  Labs Reviewed - No data to display ____________________________________________  EKG   ____________________________________________  RADIOLOGY   ____________________________________________   PROCEDURES  Procedure(s) performed: None  Procedures  Critical Care performed: No  ____________________________________________   INITIAL IMPRESSION / ASSESSMENT AND PLAN / ED COURSE  Pertinent labs & imaging results that were available during my care of the patient were reviewed by me and considered in my medical decision making (see chart for details).  Localized reaction insect bite posterior neck. Patient given discharge care instructions. Patient given prescription for Atarax, tramadol, and a Medrol Dosepak. Patient advised to follow-up with "clinic if condition persists.  Clinical Course     ____________________________________________   FINAL CLINICAL IMPRESSION(S) / ED DIAGNOSES  Final diagnoses:    Insect bite of neck with local reaction, initial encounter      NEW MEDICATIONS STARTED DURING THIS VISIT:  New Prescriptions   HYDROXYZINE (ATARAX/VISTARIL) 50 MG TABLET    Take 1 tablet (50 mg total) by mouth 3 (three) times daily as needed for itching.   METHYLPREDNISOLONE (MEDROL DOSEPAK) 4 MG TBPK TABLET    Take Tapered dose as directed   TRAMADOL (ULTRAM) 50 MG TABLET    Take 1 tablet (50 mg total) by mouth every 6 (six) hours as needed.     Note:  This document was prepared using Dragon voice recognition software and may include unintentional dictation errors.    Joni Reining, PA-C 03/29/16 2209    Sharman Cheek, MD 03/29/16 [phone]

## 2016-09-18 IMAGING — MR MR LUMBAR SPINE WO/W CM
6 of 7 series · 34 of 48 positions shown · IV contrast (14 mL MULTIHANCE)
Comparison: None.

CLINICAL DATA: Initial evaluation for low back pain for 1 month.

EXAM:
MRI LUMBAR SPINE WITHOUT AND WITH CONTRAST
TECHNIQUE: Multiplanar and multiecho pulse sequences of the lumbar spine were
obtained without and with intravenous contrast.
CONTRAST:  14mL MULTIHANCE GADOBENATE DIMEGLUMINE 529 MG/ML IV SOLN

[Series 3: T2 · sagittal · 4.0mm · 0.81mm/px · 3 of 15 slices shown (1 of 2)]
[im 1/15]
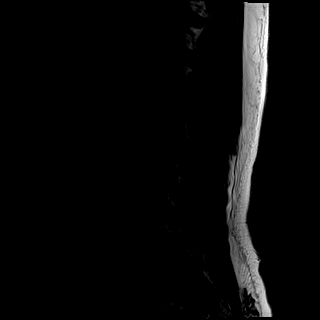
[im 8/15]
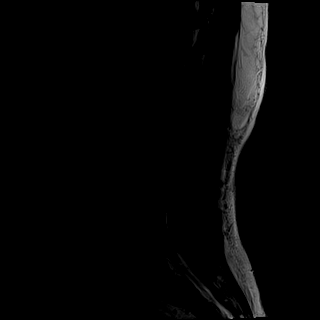
[im 15/15]
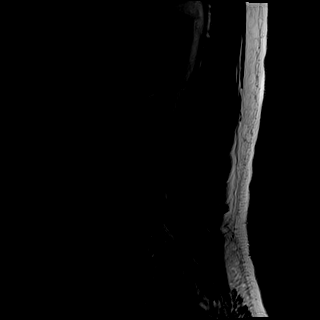

[Series 4: T1 · sagittal · 4.0mm · 0.81mm/px · 4 of 15 slices shown (1 of 2)]
[im 1/15]
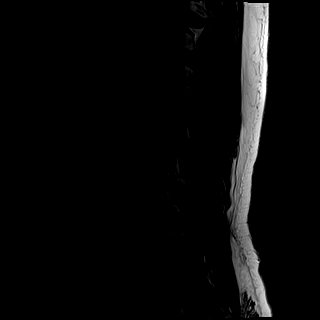
[im 5/15]
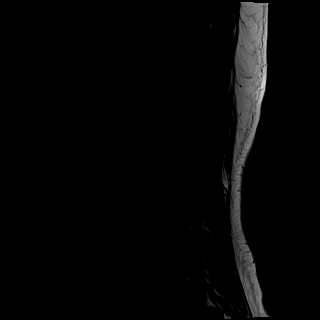
[im 10/15]
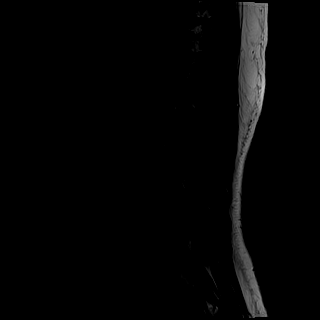
[im 15/15]
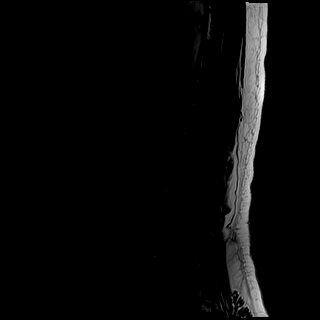

[Series 5: STIR · sagittal · 4.0mm · 1.02mm/px · 1 of 15 slices shown]
[im 1/15]
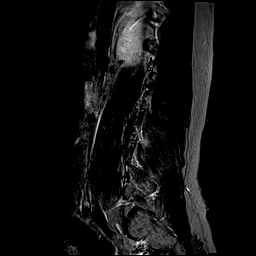

[Series 6: T2 · axial · 4.0mm · 0.78mm/px · z∈[+13,+225]mm · 11 of 40 slices shown (2 of 2)]
[im 1/40]
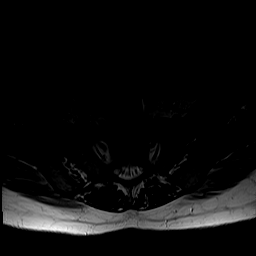
[im 4/40]
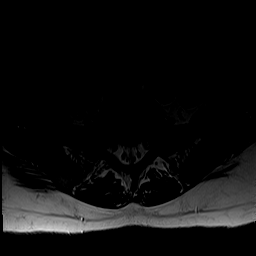
[im 8/40]
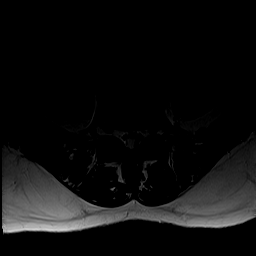
[im 12/40]
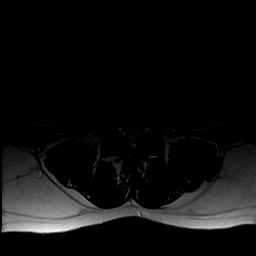
[im 16/40]
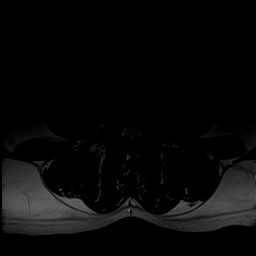
[im 20/40]
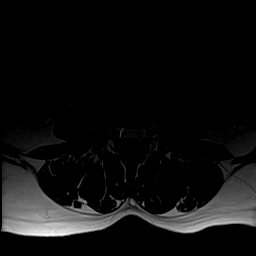
[im 24/40]
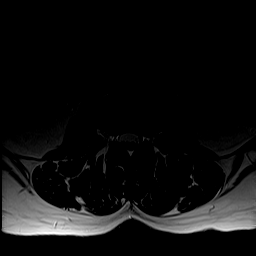
[im 28/40]
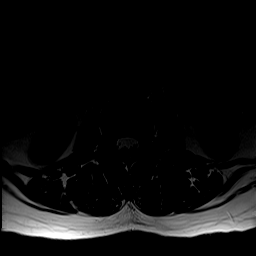
[im 32/40]
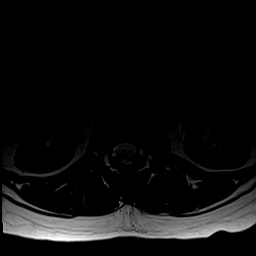
[im 36/40]
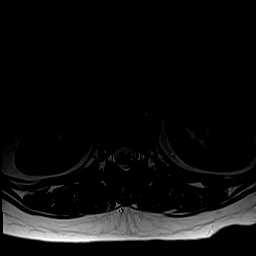
[im 40/40]
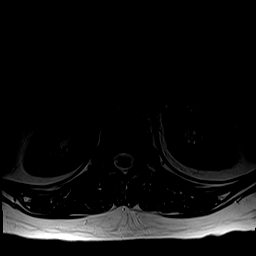

[Series 7: T1 · axial · 4.0mm · 0.39mm/px · z∈[+13,+225]mm · 11 of 40 slices shown (2 of 2)]
[im 1/40]
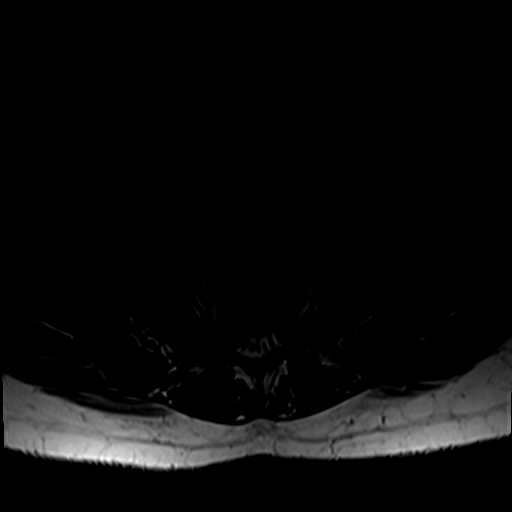
[im 4/40]
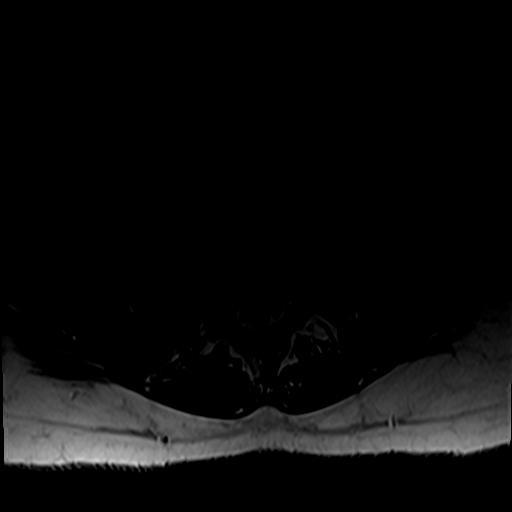
[im 8/40]
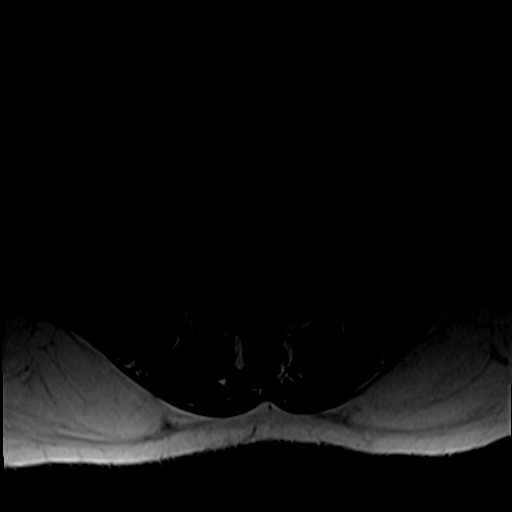
[im 12/40]
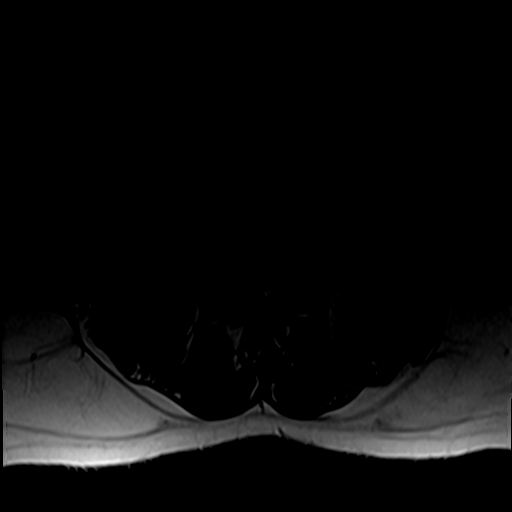
[im 16/40]
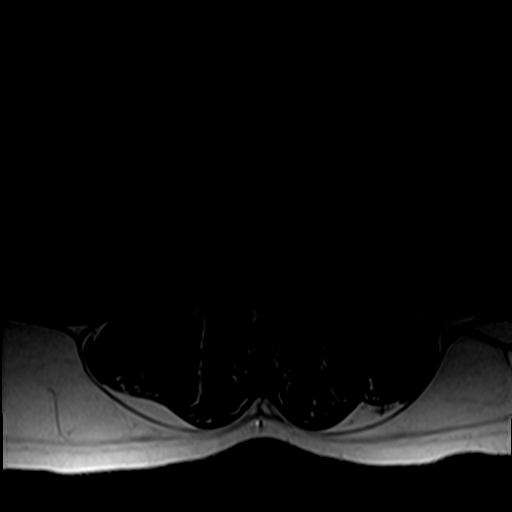
[im 20/40]
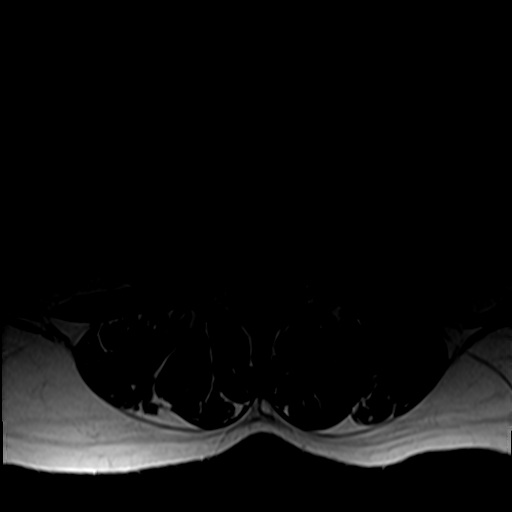
[im 24/40]
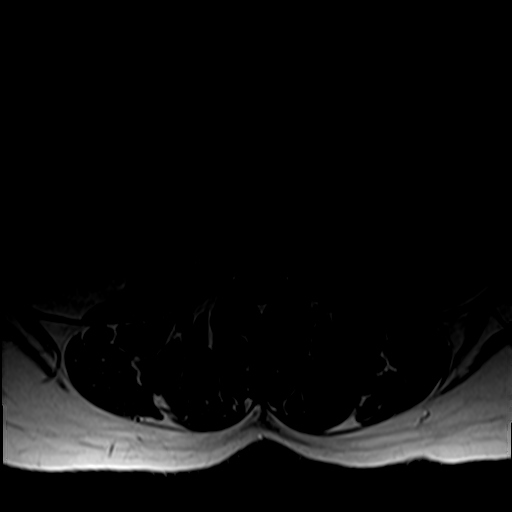
[im 28/40]
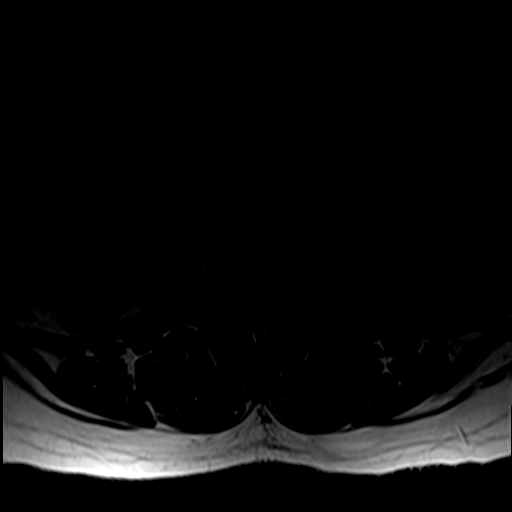
[im 32/40]
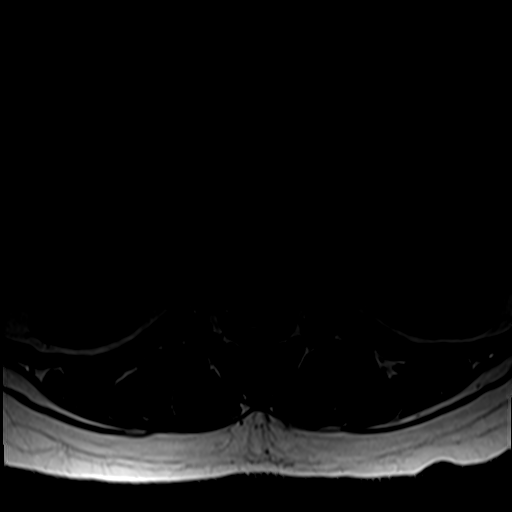
[im 36/40]
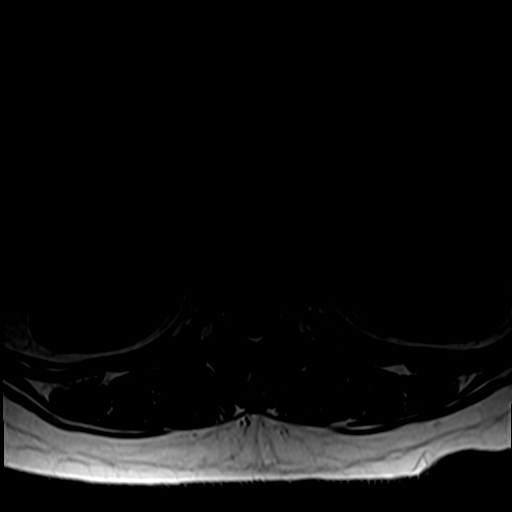
[im 40/40]
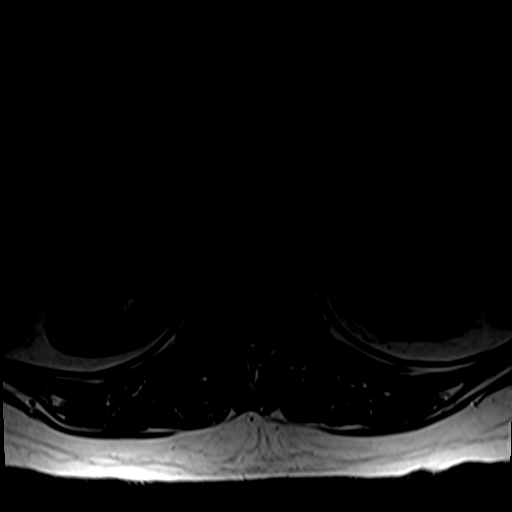

[Series 8: T1 fat-sat post-contrast · sagittal · 4.0mm · 0.81mm/px · 4 of 15 slices shown]
[im 1/15]
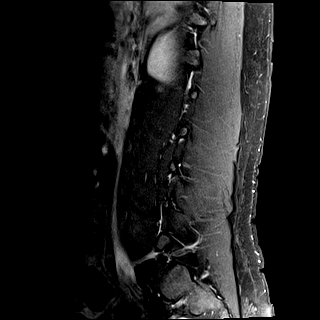
[im 5/15]
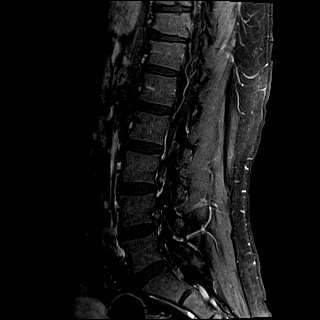
[im 10/15]
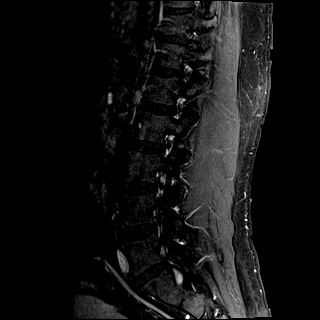
[im 15/15]
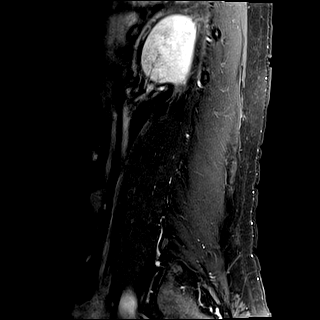

[34 of 48 positions shown; findings below may reference images not displayed]

FINDINGS: Segmentation: Normal segmentation. Lowest well-formed disc is
presumed to be the L5-S1 level.

Alignment: Vertebral bodies normally aligned with preservation of
the normal lumbar lordosis. No listhesis or subluxation.

Vertebrae: Vertebral body heights well preserved. No acute or
chronic fracture. Signal intensity within the vertebral body bone
marrow is normal. No focal osseous lesions. No marrow edema. No
abnormal enhancement.

Conus medullaris: Extends to the L1 level and appears normal.

Paraspinal and other soft tissues: Paraspinous soft tissues within
normal limits. No acute abnormality.

Disc levels:

No significant degenerative changes seen within the lumbar spine. No
significant disc bulging or focal disc herniation. No canal or
foraminal stenosis. No evidence for neural impingement.
IMPRESSION: Negative MRI of the lumbar spine. No acute abnormality identified.
No findings to explain patient's back pain

## 2017-02-02 ENCOUNTER — Encounter: Payer: Self-pay | Admitting: Emergency Medicine

## 2017-02-02 ENCOUNTER — Observation Stay
Admission: EM | Admit: 2017-02-02 | Discharge: 2017-02-04 | Disposition: A | Discharge status: Home / Self Care | Payer: Medicaid Other | Attending: Obstetrics and Gynecology | Admitting: Obstetrics and Gynecology

## 2017-02-02 DIAGNOSIS — O9989 Other specified diseases and conditions complicating pregnancy, childbirth and the puerperium: Secondary | ICD-10-CM | POA: Diagnosis not present

## 2017-02-02 DIAGNOSIS — O99283 Endocrine, nutritional and metabolic diseases complicating pregnancy, third trimester: Secondary | ICD-10-CM | POA: Insufficient documentation

## 2017-02-02 DIAGNOSIS — Z789 Other specified health status: Secondary | ICD-10-CM

## 2017-02-02 DIAGNOSIS — E876 Hypokalemia: Secondary | ICD-10-CM | POA: Insufficient documentation

## 2017-02-02 DIAGNOSIS — Z3A28 28 weeks gestation of pregnancy: Secondary | ICD-10-CM | POA: Diagnosis not present

## 2017-02-02 DIAGNOSIS — R1110 Vomiting, unspecified: Secondary | ICD-10-CM | POA: Diagnosis present

## 2017-02-02 DIAGNOSIS — G8929 Other chronic pain: Secondary | ICD-10-CM | POA: Insufficient documentation

## 2017-02-02 DIAGNOSIS — O4100X0 Oligohydramnios, unspecified trimester, not applicable or unspecified: Secondary | ICD-10-CM

## 2017-02-02 DIAGNOSIS — O99613 Diseases of the digestive system complicating pregnancy, third trimester: Secondary | ICD-10-CM | POA: Diagnosis not present

## 2017-02-02 DIAGNOSIS — F17210 Nicotine dependence, cigarettes, uncomplicated: Secondary | ICD-10-CM | POA: Insufficient documentation

## 2017-02-02 DIAGNOSIS — O219 Vomiting of pregnancy, unspecified: Secondary | ICD-10-CM | POA: Diagnosis present

## 2017-02-02 DIAGNOSIS — R197 Diarrhea, unspecified: Secondary | ICD-10-CM | POA: Insufficient documentation

## 2017-02-02 DIAGNOSIS — O99333 Smoking (tobacco) complicating pregnancy, third trimester: Secondary | ICD-10-CM | POA: Insufficient documentation

## 2017-02-02 DIAGNOSIS — M545 Low back pain: Secondary | ICD-10-CM | POA: Insufficient documentation

## 2017-02-02 DIAGNOSIS — O99353 Diseases of the nervous system complicating pregnancy, third trimester: Secondary | ICD-10-CM | POA: Diagnosis not present

## 2017-02-02 DIAGNOSIS — O212 Late vomiting of pregnancy: Principal | ICD-10-CM | POA: Insufficient documentation

## 2017-02-02 DIAGNOSIS — R112 Nausea with vomiting, unspecified: Secondary | ICD-10-CM

## 2017-02-02 DIAGNOSIS — K219 Gastro-esophageal reflux disease without esophagitis: Secondary | ICD-10-CM | POA: Diagnosis not present

## 2017-02-02 LAB — COMPREHENSIVE METABOLIC PANEL
ALT: 11 U/L — ABNORMAL LOW (ref 14–54)
AST: 16 U/L (ref 15–41)
Albumin: 3.4 g/dL — ABNORMAL LOW (ref 3.5–5.0)
Alkaline Phosphatase: 157 U/L — ABNORMAL HIGH (ref 38–126)
Anion gap: 12 (ref 5–15)
BILIRUBIN TOTAL: 0.6 mg/dL (ref 0.3–1.2)
BUN: 7 mg/dL (ref 6–20)
CALCIUM: 9 mg/dL (ref 8.9–10.3)
CO2: 20 mmol/L — ABNORMAL LOW (ref 22–32)
CREATININE: 0.69 mg/dL (ref 0.44–1.00)
Chloride: 107 mmol/L (ref 101–111)
GFR calc non Af Amer: >60 mL/min (ref >60)
GLUCOSE: 121 mg/dL — AB (ref 65–99)
Potassium: 3.3 mmol/L — ABNORMAL LOW (ref 3.5–5.1)
SODIUM: 139 mmol/L (ref 135–145)
TOTAL PROTEIN: 7.2 g/dL (ref 6.5–8.1)

## 2017-02-02 LAB — URINE DRUG SCREEN, QUALITATIVE (ARMC ONLY)
Amphetamines, Ur Screen: NOT DETECTED
BARBITURATES, UR SCREEN: NOT DETECTED
BENZODIAZEPINE, UR SCRN: NOT DETECTED
COCAINE METABOLITE, UR ~~LOC~~: NOT DETECTED
Cannabinoid 50 Ng, Ur ~~LOC~~: NOT DETECTED
MDMA (Ecstasy)Ur Screen: NOT DETECTED
METHADONE SCREEN, URINE: NOT DETECTED
OPIATE, UR SCREEN: POSITIVE — AB
PHENCYCLIDINE (PCP) UR S: NOT DETECTED
Tricyclic, Ur Screen: NOT DETECTED

## 2017-02-02 LAB — URINALYSIS, COMPLETE (UACMP) WITH MICROSCOPIC
Bilirubin Urine: NEGATIVE
GLUCOSE, UA: NEGATIVE mg/dL
HGB URINE DIPSTICK: NEGATIVE
KETONES UR: 80 mg/dL — AB
Leukocytes, UA: NEGATIVE
NITRITE: NEGATIVE
PH: 5 (ref 5.0–8.0)
PROTEIN: 100 mg/dL — AB
SPECIFIC GRAVITY, URINE: 1.03 (ref 1.005–1.030)

## 2017-02-02 LAB — GASTROINTESTINAL PANEL BY PCR, STOOL (REPLACES STOOL CULTURE)

## 2017-02-02 LAB — CBC
HCT: 38.8 % (ref 35.0–47.0)
HEMOGLOBIN: 13.2 g/dL (ref 12.0–16.0)
MCH: 29.7 pg (ref 26.0–34.0)
MCHC: 34.1 g/dL (ref 32.0–36.0)
MCV: 87.1 fL (ref 80.0–100.0)
PLATELETS: 366 10*3/uL (ref 150–440)
RBC: 4.45 MIL/uL (ref 3.80–5.20)
RDW: 14.2 % (ref 11.5–14.5)
WBC: 27.6 10*3/uL — ABNORMAL HIGH (ref 3.6–11.0)

## 2017-02-02 LAB — C DIFFICILE QUICK SCREEN W PCR REFLEX
C DIFFICILE (CDIFF) INTERP: NOT DETECTED
C DIFFICLE (CDIFF) ANTIGEN: NEGATIVE
C Diff toxin: NEGATIVE

## 2017-02-02 LAB — TYPE AND SCREEN
ABO/RH(D): O NEG
ANTIBODY SCREEN: NEGATIVE

## 2017-02-02 LAB — MAGNESIUM: MAGNESIUM: 1.8 mg/dL (ref 1.7–2.4)

## 2017-02-02 LAB — TSH: TSH: 0.633 u[IU]/mL (ref 0.350–4.500)

## 2017-02-02 LAB — LIPASE, BLOOD: LIPASE: 27 U/L (ref 11–51)

## 2017-02-02 MED ORDER — ONDANSETRON HCL 4 MG/2ML IJ SOLN
4.0000 mg | Freq: Once | INTRAMUSCULAR | Status: AC
  Administered 2017-02-02: 4 mg via INTRAVENOUS

## 2017-02-02 MED ORDER — SODIUM CHLORIDE 0.9 % IV BOLUS (SEPSIS)
1000.0000 mL | Freq: Once | INTRAVENOUS | Status: AC
Start: 2017-02-02 — End: 2017-02-02
  Administered 2017-02-02: 1000 mL via INTRAVENOUS

## 2017-02-02 MED ORDER — METOCLOPRAMIDE HCL 5 MG/ML IJ SOLN
10.0000 mg | Freq: Once | INTRAMUSCULAR | Status: AC
  Administered 2017-02-02: 10 mg via INTRAVENOUS
  Filled 2017-02-02: qty 2

## 2017-02-02 MED ORDER — FAMOTIDINE IN NACL 20-0.9 MG/50ML-% IV SOLN
INTRAVENOUS | Status: AC
  Administered 2017-02-02: 20 mg via INTRAVENOUS
  Filled 2017-02-02: qty 50

## 2017-02-02 MED ORDER — ONDANSETRON HCL 4 MG/2ML IJ SOLN
4.0000 mg | Freq: Four times a day (QID) | INTRAMUSCULAR | Status: DC | PRN
  Administered 2017-02-02 – 2017-02-03 (×2): 4 mg via INTRAVENOUS
  Filled 2017-02-02 (×2): qty 2

## 2017-02-02 MED ORDER — FAMOTIDINE IN NACL 20-0.9 MG/50ML-% IV SOLN
20.0000 mg | Freq: Once | INTRAVENOUS | Status: AC
  Administered 2017-02-02: 20 mg via INTRAVENOUS

## 2017-02-02 MED ORDER — KCL-LACTATED RINGERS-D5W 20 MEQ/L IV SOLN
INTRAVENOUS | Status: DC
  Administered 2017-02-02 – 2017-02-03 (×2): via INTRAVENOUS
  Filled 2017-02-02 (×3): qty 1000

## 2017-02-02 MED ORDER — ONDANSETRON HCL 4 MG/2ML IJ SOLN
INTRAMUSCULAR | Status: AC
  Filled 2017-02-02: qty 2

## 2017-02-02 MED ORDER — ACETAMINOPHEN 325 MG PO TABS
650.0000 mg | ORAL_TABLET | ORAL | Status: DC | PRN
  Administered 2017-02-02 – 2017-02-03 (×4): 650 mg via ORAL
  Filled 2017-02-02 (×4): qty 2

## 2017-02-02 MED ORDER — PRENATAL MULTIVITAMIN CH
1.0000 | ORAL_TABLET | Freq: Every day | ORAL | Status: DC
  Administered 2017-02-03 – 2017-02-04 (×2): 1 via ORAL
  Filled 2017-02-02 (×2): qty 1

## 2017-02-02 MED ORDER — PROMETHAZINE HCL 25 MG/ML IJ SOLN: 12.5000 mg | Freq: Four times a day (QID) | INTRAMUSCULAR | Status: DC | PRN

## 2017-02-02 NOTE — Consult Note (Signed)
Gabriela Woodard is a 31 y.o. female. [id number] @ unknown gestational age with new onset nausea/vomiting/diarrhea Patient's last menstrual period was 11/27/2016 (exact date). Estimated Date of Delivery: unknown.   Had positive pregnancy test about 4mos ago, and then what she calls a real period in June.  She has irregular periods.  Prenatal care site:   [facility]    Chief complaint: n/v/d  Location: GI tract Onset/timing: 3 days ago Duration: 3 days Severity:moderate to severe Aggravating or alleviating conditions: nothing makes better, GERD makes worse Associated signs/symptoms:  Feels unwell, dizzy, no fever / chills, +low back pain Denies sick contacts   She had 3 other high-risk pregnancies, last in 2014 G1 - oligo, C/S, term G2 - emergency C/S, term, GDMA1 G3 - term, repeat CS, GDMA1 This was not a planned pregnancy.  Maternal Medical History:   Past Medical History:  Diagnosis Date  . Seizures (HCC)     Past Surgical History:  Procedure Laterality Date  . CESAREAN SECTION      No Known Allergies  Prior to Admission medications   Medication Sig Start Date End Date Taking? Authorizing Provider  acetaminophen (TYLENOL) 500 MG tablet Take 1,000 mg by mouth every 6 (six) hours as needed.   Yes [provider]  cefUROXime (CEFTIN) 500 MG tablet Take 1 tablet (500 mg total) by mouth 2 (two) times daily with a meal. Patient not taking: Reported on 02/02/2017 01/15/16   Adrian Saran, MD  HYDROcodone-acetaminophen (NORCO/VICODIN) 5-325 MG tablet Take 1-2 tablets by mouth every 4 (four) hours as needed for moderate pain. Patient not taking: Reported on 02/02/2017 01/15/16   Adrian Saran, MD  hydrOXYzine (ATARAX/VISTARIL) 50 MG tablet Take 1 tablet (50 mg total) by mouth 3 (three) times daily as needed for itching. Patient not taking: Reported on 02/02/2017 03/29/16   Joni Reining, PA-C  methylPREDNISolone (MEDROL DOSEPAK) 4 MG TBPK tablet Take Tapered dose as directed Patient not  taking: Reported on 02/02/2017 03/29/16   Joni Reining, PA-C  nicotine (NICODERM CQ - DOSED IN MG/24 HOURS) 21 mg/24hr patch Place 1 patch (21 mg total) onto the skin daily at 8 pm. Patient not taking: Reported on 02/02/2017 01/15/16   Adrian Saran, MD  ondansetron (ZOFRAN) 4 MG tablet Take 1 tablet (4 mg total) by mouth every 6 (six) hours as needed for nausea. Patient not taking: Reported on 02/02/2017 01/15/16   Adrian Saran, MD  traMADol (ULTRAM) 50 MG tablet Take 1 tablet (50 mg total) by mouth every 6 (six) hours as needed. Patient not taking: Reported on 02/02/2017 03/29/16 03/29/17  Joni Reining, PA-C     Social History: She  reports that she has been smoking Cigarettes.  She has never used smokeless tobacco. She reports that she does not drink alcohol or use drugs.  Family History: family history includes Cancer in her maternal grandmother and other; Diabetes Mellitus II in her paternal grandmother.  Review of Systems: A full review of systems was performed and negative except as noted in the HPI.     O:  BP 109/69 (BP Location: Left Arm)   Pulse 75   Temp 98.6 F (37 C) (Oral)   Resp 18   Ht 5\' 2"  (1.575 m)   Wt 77.1 kg (170 lb)   LMP 11/27/2016 (Exact Date)   SpO2 100%   BMI 31.09 kg/m  Results for orders placed or performed during the hospital encounter of 02/02/17 (from the past 48 hour(s))  Lipase, blood  Collection Time: 02/02/17 11:07 AM  Result Value Ref Range   Lipase 27 11 - 51 U/L  Comprehensive metabolic panel   Collection Time: 02/02/17 11:07 AM  Result Value Ref Range   Sodium 139 135 - 145 mmol/L   Potassium 3.3 (L) 3.5 - 5.1 mmol/L   Chloride 107 101 - 111 mmol/L   CO2 20 (L) 22 - 32 mmol/L   Glucose, Bld 121 (H) 65 - 99 mg/dL   BUN 7 6 - 20 mg/dL   Creatinine, Ser 1.61 0.44 - 1.00 mg/dL   Calcium 9.0 8.9 - 09.6 mg/dL   Total Protein 7.2 6.5 - 8.1 g/dL   Albumin 3.4 (L) 3.5 - 5.0 g/dL   AST 16 15 - 41 U/L   ALT 11 (L) 14 - 54 U/L   Alkaline  Phosphatase 157 (H) 38 - 126 U/L   Total Bilirubin 0.6 0.3 - 1.2 mg/dL   GFR calc non Af Amer >60 >60 mL/min   GFR calc Af Amer >60 >60 mL/min   Anion gap 12 5 - 15  CBC   Collection Time: 02/02/17 11:07 AM  Result Value Ref Range   WBC 27.6 (H) 3.6 - 11.0 K/uL   RBC 4.45 3.80 - 5.20 MIL/uL   Hemoglobin 13.2 12.0 - 16.0 g/dL   HCT 04.5 40.9 - 81.1 %   MCV 87.1 80.0 - 100.0 fL   MCH 29.7 26.0 - 34.0 pg   MCHC 34.1 32.0 - 36.0 g/dL   RDW 91.4 78.2 - 95.6 %   Platelets 366 150 - 440 K/uL  Magnesium   Collection Time: 02/02/17 11:07 AM  Result Value Ref Range   Magnesium 1.8 1.7 - 2.4 mg/dL  TSH   Collection Time: 02/02/17 11:07 AM  Result Value Ref Range   TSH 0.633 0.350 - 4.500 uIU/mL  Urinalysis, Complete w Microscopic   Collection Time: 02/02/17  2:26 PM  Result Value Ref Range   Color, Urine AMBER (A) YELLOW   APPearance CLOUDY (A) CLEAR   Specific Gravity, Urine 1.030 1.005 - 1.030   pH 5.0 5.0 - 8.0   Glucose, UA NEGATIVE NEGATIVE mg/dL   Hgb urine dipstick NEGATIVE NEGATIVE   Bilirubin Urine NEGATIVE NEGATIVE   Ketones, ur 80 (A) NEGATIVE mg/dL   Protein, ur 213 (A) NEGATIVE mg/dL   Nitrite NEGATIVE NEGATIVE   Leukocytes, UA NEGATIVE NEGATIVE   RBC / HPF 0-5 0 - 5 RBC/hpf   WBC, UA 0-5 0 - 5 WBC/hpf   Bacteria, UA RARE (A) NONE SEEN   Squamous Epithelial / LPF 6-30 (A) NONE SEEN   Mucous PRESENT    Ca Oxalate Crys, UA PRESENT   Type and screen [facility]   Collection Time: 02/02/17  8:09 PM  Result Value Ref Range   ABO/RH(D) O NEG    Antibody Screen NEG    Sample Expiration 02/05/2017   Urine Drug Screen, Qualitative ([facility] only)   Collection Time: 02/02/17  8:25 PM  Result Value Ref Range   Tricyclic, Ur Screen NONE DETECTED NONE DETECTED   Amphetamines, Ur Screen NONE DETECTED NONE DETECTED   MDMA (Ecstasy)Ur Screen NONE DETECTED NONE DETECTED   Cocaine Metabolite,Ur [place] NONE DETECTED NONE DETECTED   Opiate, Ur Screen POSITIVE (A)  NONE DETECTED   Phencyclidine (PCP) Ur S NONE DETECTED NONE DETECTED   Cannabinoid 50 Ng, Ur [place] NONE DETECTED NONE DETECTED   Barbiturates, Ur Screen NONE DETECTED NONE DETECTED   Benzodiazepine, Ur Scrn  NONE DETECTED NONE DETECTED   Methadone Scn, Ur NONE DETECTED NONE DETECTED  C difficile quick scan w PCR reflex   Collection Time: 02/02/17  9:45 PM  Result Value Ref Range   C Diff antigen NEGATIVE NEGATIVE   C Diff toxin NEGATIVE NEGATIVE   C Diff interpretation No C. difficile detected.   Gastrointestinal Panel by PCR , Stool   Collection Time: 02/02/17  9:45 PM  Result Value Ref Range   Campylobacter species NOT DETECTED NOT DETECTED   Plesimonas shigelloides NOT DETECTED NOT DETECTED   Salmonella species NOT DETECTED NOT DETECTED   Yersinia enterocolitica NOT DETECTED NOT DETECTED   Vibrio species NOT DETECTED NOT DETECTED   Vibrio cholerae NOT DETECTED NOT DETECTED   Enteroaggregative E coli (EAEC) NOT DETECTED NOT DETECTED   Enteropathogenic E coli (EPEC) NOT DETECTED NOT DETECTED   Enterotoxigenic E coli (ETEC) NOT DETECTED NOT DETECTED   Shiga like toxin producing E coli (STEC) NOT DETECTED NOT DETECTED   Shigella/Enteroinvasive E coli (EIEC) NOT DETECTED NOT DETECTED   Cryptosporidium NOT DETECTED NOT DETECTED   Cyclospora cayetanensis NOT DETECTED NOT DETECTED   Entamoeba histolytica NOT DETECTED NOT DETECTED   Giardia lamblia NOT DETECTED NOT DETECTED   Adenovirus F40/41 NOT DETECTED NOT DETECTED   Astrovirus NOT DETECTED NOT DETECTED   Norovirus GI/GII NOT DETECTED NOT DETECTED   Rotavirus A NOT DETECTED NOT DETECTED   Sapovirus (I, II, IV, and V) NOT DETECTED NOT DETECTED  Comprehensive metabolic panel   Collection Time: 02/03/17  4:41 AM  Result Value Ref Range   Sodium 140 135 - 145 mmol/L   Potassium 3.2 (L) 3.5 - 5.1 mmol/L   Chloride 111 101 - 111 mmol/L   CO2 23 22 - 32 mmol/L   Glucose, Bld 117 (H) 65 - 99 mg/dL   BUN 6 6 - 20 mg/dL   Creatinine, Ser  3.240.62 0.44 - 1.00 mg/dL   Calcium 8.2 (L) 8.9 - 10.3 mg/dL   Total Protein 5.6 (L) 6.5 - 8.1 g/dL   Albumin 2.6 (L) 3.5 - 5.0 g/dL   AST 16 15 - 41 U/L   ALT 11 (L) 14 - 54 U/L   Alkaline Phosphatase 113 38 - 126 U/L   Total Bilirubin 0.4 0.3 - 1.2 mg/dL   GFR calc non Af Amer >60 >60 mL/min   GFR calc Af Amer >60 >60 mL/min   Anion gap 6 5 - 15  CBC   Collection Time: 02/03/17  4:41 AM  Result Value Ref Range   WBC 21.1 (H) 3.6 - 11.0 K/uL   RBC 3.68 (L) 3.80 - 5.20 MIL/uL   Hemoglobin 11.2 (L) 12.0 - 16.0 g/dL   HCT 40.133.0 (L) 02.735.0 - 25.347.0 %   MCV 89.7 80.0 - 100.0 fL   MCH 30.3 26.0 - 34.0 pg   MCHC 33.8 32.0 - 36.0 g/dL   RDW 66.414.2 40.311.5 - 47.414.5 %   Platelets 314 150 - 440 K/uL     Constitutional: NAD, AAOx3, uncomfortable  HE/ENT: extraocular movements grossly intact, moist mucous membranes CV: RRR PULM: nl respiratory effort, CTABL     Abd: gravid, non-tender, non-distended, soft  Back: no CVA tenderness, +bilateral low-back ttp     Ext: Non-tender, Nonedmeatous   Psych: mood appropriate, speech normal Pelvic deferred    A/P: 31 y.o. Unknown here for intractable nausea/vomiting/diarrhea.   1. Admit to antepartum 2. Anti-emetics, stool cultures pending - if neg can add loperamide.  Enteric precautions  3. IVF with KCL to replete 4. GERD - start with pepcid and TUMS will modify PRN 5. Unknown EGA - ultrasound  6. Fetal hearts daily.  ----- Ranae Plumber, MD Attending Obstetrician and Gynecologist [facility], Department of OB/GYN [facility]

## 2017-02-02 NOTE — ED Triage Notes (Signed)
Pt here from home via [facility] with c/o N/V/D x2 days and lower back pain. Pt immediately needed to go to bathroom in lobby, vomiting and diarrhea.

## 2017-02-02 NOTE — ED Provider Notes (Signed)
[facility] Emergency Department Provider Note ____________________________________________   First MD Initiated Contact with Patient 02/02/17 1130     (approximate)  I have reviewed the triage vital signs and the nursing notes.   HISTORY  Chief Complaint Emesis    HPI Gabriela Woodard is a 31 y.o. female G4P3 who presents with vomiting 2 days. Course is persistent. Vomitus is nonbloody and nonbilious. Associated with diarrhea since yesterday. No associated abdominal pain. Patient also reports chronic back pain which she has had previously during the pregnancy. She denies any vaginal bleeding or discharge, leakage of fluid, or any urinary symptoms.No prior history of these symptoms during her pregnancy.  Past Medical History:  Diagnosis Date  . Seizures [facility](HCC)     Patient Active Problem List   Diagnosis Date Noted  . Sepsis (HCC) 01/14/2016  . UTI (lower urinary tract infection) 01/14/2016  . Leukocytosis, unspecified 03/17/2013  . Gestational diabetes mellitus, class A1 02/03/2013  . H/O gestational diabetes in prior pregnancy, currently pregnant 11/29/2012  . Rh negative state in antepartum period 10/26/2012  . Supervision of other normal pregnancy 10/25/2012  . Previous cesarean section 10/25/2012    Past Surgical History:  Procedure Laterality Date  . CESAREAN SECTION      Prior to Admission medications   Medication Sig Start Date End Date Taking? Authorizing Provider  cefUROXime (CEFTIN) 500 MG tablet Take 1 tablet (500 mg total) by mouth 2 (two) times daily with a meal. 01/15/16   Adrian SaranMody, Sital, MD  HYDROcodone-acetaminophen (NORCO/VICODIN) 5-325 MG tablet Take 1-2 tablets by mouth every 4 (four) hours as needed for moderate pain. 01/15/16   Adrian SaranMody, Sital, MD  hydrOXYzine (ATARAX/VISTARIL) 50 MG tablet Take 1 tablet (50 mg total) by mouth 3 (three) times daily as needed for itching. 03/29/16   Joni ReiningSmith, Ronald K, PA-C  methylPREDNISolone (MEDROL  DOSEPAK) 4 MG TBPK tablet Take Tapered dose as directed 03/29/16   Joni ReiningSmith, Ronald K, PA-C  nicotine (NICODERM CQ - DOSED IN MG/24 HOURS) 21 mg/24hr patch Place 1 patch (21 mg total) onto the skin daily at 8 pm. 01/15/16   Adrian SaranMody, Sital, MD  ondansetron (ZOFRAN) 4 MG tablet Take 1 tablet (4 mg total) by mouth every 6 (six) hours as needed for nausea. 01/15/16   Adrian SaranMody, Sital, MD  traMADol (ULTRAM) 50 MG tablet Take 1 tablet (50 mg total) by mouth every 6 (six) hours as needed. 03/29/16 03/29/17  Joni ReiningSmith, Ronald K, PA-C    Allergies Patient has no known allergies.  Family History  Problem Relation Age of Onset  . Cancer Maternal Grandmother        breast  . Cancer Other        breast; maternal grandma's sister.  . Diabetes Mellitus II Paternal Grandmother     Social History Social History  Substance Use Topics  . Smoking status: Current Every Day Smoker    Types: Cigarettes  . Smokeless tobacco: Never Used  . Alcohol use No    Review of Systems  Constitutional: No fever/chills Eyes: No visual changes. ENT: No sore throat. Cardiovascular: Denies chest pain. Respiratory: Denies shortness of breath. Gastrointestinal: Positive for nausea, vomiting, and diarrhea. Negative for abdominal pain.  Genitourinary: Negative for dysuria.  Musculoskeletal: Positive for low back pain. Skin: Negative for rash. Neurological: Negative for headaches, focal weakness or numbness.   ____________________________________________   PHYSICAL EXAM:  VITAL SIGNS: ED Triage Vitals  Enc Vitals Group     BP 02/02/17 1103 125/78  Pulse Rate 02/02/17 1103 89     Resp 02/02/17 1103 20     Temp 02/02/17 1103 98 F (36.7 C)     Temp Source 02/02/17 1103 Oral     SpO2 02/02/17 1103 98 %     Weight 02/02/17 1104 170 lb (77.1 kg)     Height 02/02/17 1104 5\' 1"  (1.549 m)     Head Circumference --      Peak Flow --      Pain Score 02/02/17 1102 8     Pain Loc --      Pain Edu? --      Excl. in GC? --     Constitutional: Alert and oriented. Well appearing and in no acute distress. Eyes: Conjunctivae are normal.  Head: Atraumatic. Nose: No congestion/rhinnorhea. Mouth/Throat: Mucous membranes are moist.   Neck: Normal range of motion.  Cardiovascular: Normal rate, regular rhythm. Grossly normal heart sounds.  Good peripheral circulation. Respiratory: Normal respiratory effort.  No retractions. Lungs CTAB. Gastrointestinal: Soft and nontender. No distention.  Genitourinary: No CVA tenderness. Musculoskeletal: No lower extremity edema.  Extremities warm and well perfused.  Neurologic:  Normal speech and language. No gross focal neurologic deficits are appreciated.  Skin:  Skin is warm and dry. No rash noted. Psychiatric: Mood and affect are normal. Speech and behavior are normal.  ____________________________________________   LABS (all labs ordered are listed, but only abnormal results are displayed)  Labs Reviewed  CBC - Abnormal; Notable for the following:       Result Value   WBC 27.6 (*)    All other components within normal limits  LIPASE, BLOOD  COMPREHENSIVE METABOLIC PANEL  URINALYSIS, COMPLETE (UACMP) WITH MICROSCOPIC   ____________________________________________  EKG ____________________________________________  RADIOLOGY    ____________________________________________   PROCEDURES  Procedure(s) performed: No    Critical Care performed: No ____________________________________________   INITIAL IMPRESSION / ASSESSMENT AND PLAN / ED COURSE  Pertinent labs & imaging results that were available during my care of the patient were reviewed by me and considered in my medical decision making (see chart for details).  31 year old female, approximately [redacted] weeks pregnant, presents with 2 days nausea vomiting diarrhea. Also reports low back pain, but no abdominal pain. On exam vital signs are normal. Patient uncomfortable but not acutely ill appearing. Abdomen  soft and nontender. Overall, presentation consistent with acute gastroenteritis. Given no abdominal pain, no vaginal bleeding or discharge and normal fetal heart rate in triage, no evidence of preterm labor or indication for further ob workup. Plan: Basic labs, UA, fluids, antiemetics, and reassess.  3:55PM  Pt with persistent emesis and nausea despite medications.  Negative UA and lab workup.  Plan for admission to ob/gyn.  Report given to Dr. Elesa Massed.    ____________________________________________   FINAL CLINICAL IMPRESSION(S) / ED DIAGNOSES  Final diagnoses:  None      NEW MEDICATIONS STARTED DURING THIS VISIT:  New Prescriptions   No medications on file     Note:  This document was prepared using Dragon voice recognition software and may include unintentional dictation errors.    Dionne Bucy, MD 02/02/17 [phone]

## 2017-02-02 NOTE — ED Notes (Signed)
Patient updated on delay. Given apple juice. No further needs expressed at this time.

## 2017-02-02 NOTE — ED Triage Notes (Signed)
[facility] with complaints of N&V&D since Sat. Estimates throwing up more than 30 times and diarrhea more than 20 times.  Alert and oriented.  Complaining of lower back pain.  Patient is [redacted] weeks pregnant - due date Sept. 28, 2018.  Sees [facility].  Denies vaginal discharge at this time.

## 2017-02-03 ENCOUNTER — Observation Stay: Payer: Medicaid Other

## 2017-02-03 LAB — COMPREHENSIVE METABOLIC PANEL
ALK PHOS: 113 U/L (ref 38–126)
ALT: 11 U/L — AB (ref 14–54)
ANION GAP: 6 (ref 5–15)
AST: 16 U/L (ref 15–41)
Albumin: 2.6 g/dL — ABNORMAL LOW (ref 3.5–5.0)
BILIRUBIN TOTAL: 0.4 mg/dL (ref 0.3–1.2)
BUN: 6 mg/dL (ref 6–20)
CALCIUM: 8.2 mg/dL — AB (ref 8.9–10.3)
CO2: 23 mmol/L (ref 22–32)
CREATININE: 0.62 mg/dL (ref 0.44–1.00)
Chloride: 111 mmol/L (ref 101–111)
GFR calc non Af Amer: >60 mL/min (ref >60)
GLUCOSE: 117 mg/dL — AB (ref 65–99)
Potassium: 3.2 mmol/L — ABNORMAL LOW (ref 3.5–5.1)
SODIUM: 140 mmol/L (ref 135–145)
TOTAL PROTEIN: 5.6 g/dL — AB (ref 6.5–8.1)

## 2017-02-03 LAB — CBC
HCT: 33 % — ABNORMAL LOW (ref 35.0–47.0)
HEMOGLOBIN: 11.2 g/dL — AB (ref 12.0–16.0)
MCH: 30.3 pg (ref 26.0–34.0)
MCHC: 33.8 g/dL (ref 32.0–36.0)
MCV: 89.7 fL (ref 80.0–100.0)
PLATELETS: 314 10*3/uL (ref 150–440)
RBC: 3.68 MIL/uL — ABNORMAL LOW (ref 3.80–5.20)
RDW: 14.2 % (ref 11.5–14.5)
WBC: 21.1 10*3/uL — ABNORMAL HIGH (ref 3.6–11.0)

## 2017-02-03 LAB — RAPID HIV SCREEN (HIV 1/2 AB+AG)
HIV 1/2 Antibodies: NONREACTIVE
HIV-1 P24 Antigen - HIV24: NONREACTIVE

## 2017-02-03 LAB — GLUCOSE, FASTING GESTATIONAL: Glucose Tolerance, Fasting: 105 mg/dL

## 2017-02-03 MED ORDER — THIAMINE HCL 100 MG/ML IJ SOLN
Freq: Once | INTRAVENOUS | Status: AC
  Administered 2017-02-03: 12:00:00 via INTRAVENOUS
  Filled 2017-02-03: qty 1000

## 2017-02-03 MED ORDER — FAMOTIDINE IN NACL 20-0.9 MG/50ML-% IV SOLN
20.0000 mg | Freq: Once | INTRAVENOUS | Status: AC
  Administered 2017-02-03: 20 mg via INTRAVENOUS
  Filled 2017-02-03: qty 50

## 2017-02-03 MED ORDER — POTASSIUM CHLORIDE 10 MEQ/50ML IV SOLN
10.0000 meq | INTRAVENOUS | Status: DC | PRN
  Filled 2017-02-03: qty 50

## 2017-02-03 MED ORDER — FAMOTIDINE 20 MG PO TABS: 20.0000 mg | ORAL_TABLET | Freq: Two times a day (BID) | ORAL | Status: DC

## 2017-02-03 MED ORDER — FAMOTIDINE 20 MG PO TABS
20.0000 mg | ORAL_TABLET | Freq: Two times a day (BID) | ORAL | Status: DC
  Administered 2017-02-03 – 2017-02-04 (×3): 20 mg via ORAL
  Filled 2017-02-03 (×2): qty 1

## 2017-02-03 MED ORDER — FAMOTIDINE 20 MG PO TABS
20.0000 mg | ORAL_TABLET | Freq: Two times a day (BID) | ORAL | Status: DC
  Filled 2017-02-03: qty 1

## 2017-02-03 MED ORDER — LACTATED RINGERS IV SOLN
INTRAVENOUS | Status: DC
  Administered 2017-02-03: 22:00:00 via INTRAVENOUS

## 2017-02-03 MED ORDER — CALCIUM CARBONATE ANTACID 500 MG PO CHEW
2.0000 | CHEWABLE_TABLET | Freq: Two times a day (BID) | ORAL | Status: DC | PRN
  Administered 2017-02-03 (×2): 400 mg via ORAL
  Filled 2017-02-03 (×2): qty 2

## 2017-02-03 MED ORDER — PANTOPRAZOLE SODIUM 40 MG IV SOLR
40.0000 mg | INTRAVENOUS | Status: DC
  Administered 2017-02-03 – 2017-02-04 (×2): 40 mg via INTRAVENOUS
  Filled 2017-02-03 (×2): qty 40

## 2017-02-03 NOTE — Progress Notes (Signed)
Patient finished her glucola drink at 12:52 for her GDM testing. However patient then vomited the glucola drink back up. Total was 450 mL of emesis. Will notify MD of complications.    Oswald HillockAbigail Garner, RN

## 2017-02-03 NOTE — Progress Notes (Signed)
Ultrasound performed,  [place] Ob Comp + 14 Wk  Result Date: 02/03/2017 CLINICAL DATA:  Pregnancy.  Evaluate for dating. EXAM: OBSTETRICAL ULTRASOUND >14 WKS FINDINGS: Number of Fetuses: 1 Heart Rate:  139 Bpm Movement: Present Presentation: Cephalic Previa: No Placental Location: Anterior fundal Amniotic Fluid (Subjective): Decreased Amniotic Fluid (Objective): AFI 6.0 cm (5%ile= 8.3 cm, 95%= 24.5 cm for 33 wks) FETAL BIOMETRY BPD:  8.1cm 32w 3d HC:    31.0cm  35w   0d AC:   29.9cm  33w   5d FL:   6.4cm  33w   2d Current Mean GA: 33w 4d              US EDC: 03/20/2017. Estimated Fetal Weight:  2,229 g 50-90%ile FETAL ANATOMY Lateral Ventricles: Visualize Thalami/CSP: Visualized Posterior Fossa:  Not visualized Nuchal Region: Not visualized Upper Lip: Not visualized Spine: Limited visualization 4 Chamber Heart on Left: Visualized LVOT: Visualized RVOT: Visualized Stomach on Left: Visualized 3 Vessel Cord: Visualized Cord Insertion site: Visualized Kidneys: Limited visualization Bladder: Visualized Extremities: Visualized Sex: Visualized Technically difficult due to: Low AFI, advanced gestational age, and patient (mother) movement. The mother was a couple during the exam and could not remain still. Maternal Findings: Cervix:  Not well visualized. IMPRESSION: 1. Single viable intrauterine pregnancy at 33 weeks 4 days. Fetal presentation is cephalic. 2.  Low AFI. Exam was limited due to low AFI, advanced gestational age, and patient (mother) movement. These results will be called to the ordering clinician or representative by the Radiologist Assistant, and communication documented in the PACS or zVision Dashboard. Electronically Signed   By: Maisie Fushomas  Register   On: 02/03/2017 11:58    Much further along than estimated.   No prenatal labs done yet, no glucola with history of GDM  Will order prenatal labs   ----- Ranae Plumberhelsea Kyliah Deanda, MD Attending Obstetrician and Gynecologist [facility], Department of  OB/GYN [facility]

## 2017-02-03 NOTE — Progress Notes (Signed)
Obstetric and Gynecology  HD 2  Subjective   1. Complaining of severe heartburn, adding to her nausea.  From yesterday nausea is improved, not as many bouts of diarrhea.  Still very uncomfortable.   Objective  Objective:   Vitals:   02/02/17 2005 02/02/17 2331 02/03/17 0256 02/03/17 0711  BP: 120/63 (!) 109/50 112/66 109/69  Pulse: 68 (!) 58 68 75  Resp: 18 18 18 18   Temp: 98.5 F (36.9 C) 98 F (36.7 C) 99.2 F (37.3 C) 98.6 F (37 C)  TempSrc: Oral Oral Oral Oral  SpO2: 99% 98% 99% 100%  Weight:      Height:        General: NAD Cardiovascular: RRR, no murmurs Pulmonary: CTAB, normal respiratory effort Abdomen: Benign. Non-tender, +BS, no guarding. Back: no CVA ttp Extremities: No erythema or cords, no calf tenderness, with normal peripheral pulses.  Labs: Results for orders placed or performed during the hospital encounter of 02/02/17 (from the past 24 hour(s))  Lipase, blood     Status: None   Collection Time: 02/02/17 11:07 AM  Result Value Ref Range   Lipase 27 11 - 51 U/L  Comprehensive metabolic panel     Status: Abnormal   Collection Time: 02/02/17 11:07 AM  Result Value Ref Range   Sodium 139 135 - 145 mmol/L   Potassium 3.3 (L) 3.5 - 5.1 mmol/L   Chloride 107 101 - 111 mmol/L   CO2 20 (L) 22 - 32 mmol/L   Glucose, Bld 121 (H) 65 - 99 mg/dL   BUN 7 6 - 20 mg/dL   Creatinine, Ser 8.650.69 0.44 - 1.00 mg/dL   Calcium 9.0 8.9 - 78.410.3 mg/dL   Total Protein 7.2 6.5 - 8.1 g/dL   Albumin 3.4 (L) 3.5 - 5.0 g/dL   AST 16 15 - 41 U/L   ALT 11 (L) 14 - 54 U/L   Alkaline Phosphatase 157 (H) 38 - 126 U/L   Total Bilirubin 0.6 0.3 - 1.2 mg/dL   GFR calc non Af Amer >60 >60 mL/min   GFR calc Af Amer >60 >60 mL/min   Anion gap 12 5 - 15  CBC     Status: Abnormal   Collection Time: 02/02/17 11:07 AM  Result Value Ref Range   WBC 27.6 (H) 3.6 - 11.0 K/uL   RBC 4.45 3.80 - 5.20 MIL/uL   Hemoglobin 13.2 12.0 - 16.0 g/dL   HCT 69.638.8 29.535.0 - 28.447.0 %   MCV 87.1 80.0 -  100.0 fL   MCH 29.7 26.0 - 34.0 pg   MCHC 34.1 32.0 - 36.0 g/dL   RDW 13.214.2 44.011.5 - 10.214.5 %   Platelets 366 150 - 440 K/uL  Magnesium     Status: None   Collection Time: 02/02/17 11:07 AM  Result Value Ref Range   Magnesium 1.8 1.7 - 2.4 mg/dL  TSH     Status: None   Collection Time: 02/02/17 11:07 AM  Result Value Ref Range   TSH 0.633 0.350 - 4.500 uIU/mL  Urinalysis, Complete w Microscopic     Status: Abnormal   Collection Time: 02/02/17  2:26 PM  Result Value Ref Range   Color, Urine AMBER (A) YELLOW   APPearance CLOUDY (A) CLEAR   Specific Gravity, Urine 1.030 1.005 - 1.030   pH 5.0 5.0 - 8.0   Glucose, UA NEGATIVE NEGATIVE mg/dL   Hgb urine dipstick NEGATIVE NEGATIVE   Bilirubin Urine NEGATIVE NEGATIVE   Ketones, ur 80 (A)  NEGATIVE mg/dL   Protein, ur 161 (A) NEGATIVE mg/dL   Nitrite NEGATIVE NEGATIVE   Leukocytes, UA NEGATIVE NEGATIVE   RBC / HPF 0-5 0 - 5 RBC/hpf   WBC, UA 0-5 0 - 5 WBC/hpf   Bacteria, UA RARE (A) NONE SEEN   Squamous Epithelial / LPF 6-30 (A) NONE SEEN   Mucous PRESENT    Ca Oxalate Crys, UA PRESENT   Type and screen [facility]     Status: None   Collection Time: 02/02/17  8:09 PM  Result Value Ref Range   ABO/RH(D) O NEG    Antibody Screen NEG    Sample Expiration 02/05/2017   Urine Drug Screen, Qualitative ([facility] only)     Status: Abnormal   Collection Time: 02/02/17  8:25 PM  Result Value Ref Range   Tricyclic, Ur Screen NONE DETECTED NONE DETECTED   Amphetamines, Ur Screen NONE DETECTED NONE DETECTED   MDMA (Ecstasy)Ur Screen NONE DETECTED NONE DETECTED   Cocaine Metabolite,Ur [place] NONE DETECTED NONE DETECTED   Opiate, Ur Screen POSITIVE (A) NONE DETECTED   Phencyclidine (PCP) Ur S NONE DETECTED NONE DETECTED   Cannabinoid 50 Ng, Ur [place] NONE DETECTED NONE DETECTED   Barbiturates, Ur Screen NONE DETECTED NONE DETECTED   Benzodiazepine, Ur Scrn NONE DETECTED NONE DETECTED   Methadone Scn, Ur NONE DETECTED NONE DETECTED  C  difficile quick scan w PCR reflex     Status: None   Collection Time: 02/02/17  9:45 PM  Result Value Ref Range   C Diff antigen NEGATIVE NEGATIVE   C Diff toxin NEGATIVE NEGATIVE   C Diff interpretation No C. difficile detected.   Gastrointestinal Panel by PCR , Stool     Status: None   Collection Time: 02/02/17  9:45 PM  Result Value Ref Range   Campylobacter species NOT DETECTED NOT DETECTED   Plesimonas shigelloides NOT DETECTED NOT DETECTED   Salmonella species NOT DETECTED NOT DETECTED   Yersinia enterocolitica NOT DETECTED NOT DETECTED   Vibrio species NOT DETECTED NOT DETECTED   Vibrio cholerae NOT DETECTED NOT DETECTED   Enteroaggregative E coli (EAEC) NOT DETECTED NOT DETECTED   Enteropathogenic E coli (EPEC) NOT DETECTED NOT DETECTED   Enterotoxigenic E coli (ETEC) NOT DETECTED NOT DETECTED   Shiga like toxin producing E coli (STEC) NOT DETECTED NOT DETECTED   Shigella/Enteroinvasive E coli (EIEC) NOT DETECTED NOT DETECTED   Cryptosporidium NOT DETECTED NOT DETECTED   Cyclospora cayetanensis NOT DETECTED NOT DETECTED   Entamoeba histolytica NOT DETECTED NOT DETECTED   Giardia lamblia NOT DETECTED NOT DETECTED   Adenovirus F40/41 NOT DETECTED NOT DETECTED   Astrovirus NOT DETECTED NOT DETECTED   Norovirus GI/GII NOT DETECTED NOT DETECTED   Rotavirus A NOT DETECTED NOT DETECTED   Sapovirus (I, II, IV, and V) NOT DETECTED NOT DETECTED  Comprehensive metabolic panel     Status: Abnormal   Collection Time: 02/03/17  4:41 AM  Result Value Ref Range   Sodium 140 135 - 145 mmol/L   Potassium 3.2 (L) 3.5 - 5.1 mmol/L   Chloride 111 101 - 111 mmol/L   CO2 23 22 - 32 mmol/L   Glucose, Bld 117 (H) 65 - 99 mg/dL   BUN 6 6 - 20 mg/dL   Creatinine, Ser 0.96 0.44 - 1.00 mg/dL   Calcium 8.2 (L) 8.9 - 10.3 mg/dL   Total Protein 5.6 (L) 6.5 - 8.1 g/dL   Albumin 2.6 (L) 3.5 - 5.0 g/dL  AST 16 15 - 41 U/L   ALT 11 (L) 14 - 54 U/L   Alkaline Phosphatase 113 38 - 126 U/L   Total  Bilirubin 0.4 0.3 - 1.2 mg/dL   GFR calc non Af Amer >60 >60 mL/min   GFR calc Af Amer >60 >60 mL/min   Anion gap 6 5 - 15  CBC     Status: Abnormal   Collection Time: 02/03/17  4:41 AM  Result Value Ref Range   WBC 21.1 (H) 3.6 - 11.0 K/uL   RBC 3.68 (L) 3.80 - 5.20 MIL/uL   Hemoglobin 11.2 (L) 12.0 - 16.0 g/dL   HCT 60.4 (L) 54.0 - 98.1 %   MCV 89.7 80.0 - 100.0 fL   MCH 30.3 26.0 - 34.0 pg   MCHC 33.8 32.0 - 36.0 g/dL   RDW 19.1 47.8 - 29.5 %   Platelets 314 150 - 440 K/uL    Cultures: Results for orders placed or performed during the hospital encounter of 02/02/17  C difficile quick scan w PCR reflex     Status: None   Collection Time: 02/02/17  9:45 PM  Result Value Ref Range Status   C Diff antigen NEGATIVE NEGATIVE Final   C Diff toxin NEGATIVE NEGATIVE Final   C Diff interpretation No C. difficile detected.  Final    Comment: VALID  Gastrointestinal Panel by PCR , Stool     Status: None   Collection Time: 02/02/17  9:45 PM  Result Value Ref Range Status   Campylobacter species NOT DETECTED NOT DETECTED Final   Plesimonas shigelloides NOT DETECTED NOT DETECTED Final   Salmonella species NOT DETECTED NOT DETECTED Final   Yersinia enterocolitica NOT DETECTED NOT DETECTED Final   Vibrio species NOT DETECTED NOT DETECTED Final   Vibrio cholerae NOT DETECTED NOT DETECTED Final   Enteroaggregative E coli (EAEC) NOT DETECTED NOT DETECTED Final   Enteropathogenic E coli (EPEC) NOT DETECTED NOT DETECTED Final   Enterotoxigenic E coli (ETEC) NOT DETECTED NOT DETECTED Final   Shiga like toxin producing E coli (STEC) NOT DETECTED NOT DETECTED Final   Shigella/Enteroinvasive E coli (EIEC) NOT DETECTED NOT DETECTED Final   Cryptosporidium NOT DETECTED NOT DETECTED Final   Cyclospora cayetanensis NOT DETECTED NOT DETECTED Final   Entamoeba histolytica NOT DETECTED NOT DETECTED Final   Giardia lamblia NOT DETECTED NOT DETECTED Final   Adenovirus F40/41 NOT DETECTED NOT DETECTED  Final   Astrovirus NOT DETECTED NOT DETECTED Final   Norovirus GI/GII NOT DETECTED NOT DETECTED Final   Rotavirus A NOT DETECTED NOT DETECTED Final   Sapovirus (I, II, IV, and V) NOT DETECTED NOT DETECTED Final    Assessment   31 y.o. [id number] Hospital Day: 2 with GI distress  Plan   1. GERD:  Add protonix daily to pepcid bid and tums 2. N/V/D: improving.  Can give loperamide now that cultures are negative, continue phenergan and ondansetron.   3. Hypokalemia: s/p KCL without improvement.  Will do banana bag and add KCl, then do PO k-dur 4. IUP: ultrasound today for dating, possibly growth if far enough along.  ----- Ranae Plumber, MD Attending Obstetrician and Gynecologist [facility], Department of OB/GYN [facility]

## 2017-02-03 NOTE — Progress Notes (Signed)
Pt off unit to "go for a walk" explained smoking policy to pt and visitor. Encouraged pt no to leave unit. Pt states she is "going outside for some fresh air".

## 2017-02-03 NOTE — Progress Notes (Signed)
Dr. Elesa MassedWard asked RN to check pt for CVA tenderness; if pt is + on either side then order a renal ultrasound; RN in room and checked for CVA tenderness:  Negative; kpad then applied to pt's back for comfort

## 2017-02-03 NOTE — Progress Notes (Signed)
Patient ID: Gabriela Woodard, female   DOB: 02/26/1986, 31 y.o.   MRN: [id number] Given  AFI is 6  And unsure dating I will get an NST on her  Tonight .  She is hungry so  I will advance her diet

## 2017-02-04 ENCOUNTER — Observation Stay: Payer: Medicaid Other

## 2017-02-04 LAB — CBC
HEMATOCRIT: 30.9 % — AB (ref 35.0–47.0)
Hemoglobin: 10.6 g/dL — ABNORMAL LOW (ref 12.0–16.0)
MCH: 31 pg (ref 26.0–34.0)
MCHC: 34.4 g/dL (ref 32.0–36.0)
MCV: 90.2 fL (ref 80.0–100.0)
PLATELETS: 298 10*3/uL (ref 150–440)
RBC: 3.43 MIL/uL — ABNORMAL LOW (ref 3.80–5.20)
RDW: 14.5 % (ref 11.5–14.5)
WBC: 19.8 10*3/uL — AB (ref 3.6–11.0)

## 2017-02-04 LAB — GLUCOSE, CAPILLARY
GLUCOSE-CAPILLARY: 76 mg/dL (ref 65–99)
Glucose-Capillary: 86 mg/dL (ref 65–99)
Glucose-Capillary: 134 mg/dL — ABNORMAL HIGH (ref 65–99)

## 2017-02-04 LAB — VARICELLA ZOSTER ANTIBODY, IGG: Varicella IgG: <135 index — ABNORMAL LOW (ref >165)

## 2017-02-04 LAB — HEPATITIS B SURFACE ANTIGEN: HEP B S AG: NEGATIVE

## 2017-02-04 LAB — RUBELLA SCREEN: Rubella: 1.99 index (ref >0.99)

## 2017-02-04 LAB — RPR: RPR: NONREACTIVE

## 2017-02-04 MED ORDER — BETAMETHASONE SOD PHOS & ACET 6 (3-3) MG/ML IJ SUSP
12.0000 mg | INTRAMUSCULAR | Status: DC
  Administered 2017-02-04: 12 mg via INTRAMUSCULAR
  Filled 2017-02-04: qty 2

## 2017-02-04 MED ORDER — PROMETHAZINE HCL 12.5 MG PO TABS: 12.5000 mg | ORAL_TABLET | ORAL | 1 refills | Status: DC | PRN

## 2017-02-04 NOTE — Progress Notes (Signed)
Patient requesting to leave against medical advice. MD paged and is to come see patient.

## 2017-02-04 NOTE — Progress Notes (Signed)
pt discharged home with family.  Discharge instructions, prescriptions and follow up appointment given to and reviewed with pt.  Pt verbalized understanding, all questions answered.  Escorted by auxiliary. 

## 2017-02-04 NOTE — Progress Notes (Signed)
Subjective: Patient reports N/V much improved  From yest   No PNC  Low AFI yesterday   3 prior c/s ( no records yet)  couldn't complete the 3 hr GTT   Objective: I have reviewed patient's vital signs and radiology results.  General: alert and cooperative Resp: clear to auscultation bilaterally Cardio: regular rate and rhythm, S1, S2 normal, no murmur, click, rub or gallop GI: soft, non-tender; bowel sounds normal; no masses,  no organomegaly  Fasting Glucose 86  Assessment/Plan: POOR PNC  borderline oligohydramnios Prior c/s  Repeat AFI today , repeat NST  Round up records   LOS: 0 days    Ihor Austinhomas J Tyrah Broers 02/04/2017, 9:14 AM

## 2017-02-04 NOTE — Progress Notes (Signed)
Pt in wheelchair to Ultrasound.

## 2017-02-04 NOTE — Discharge Summary (Signed)
Physician Discharge Summary  Patient ID: Gabriela Woodard MRN: [id number] DOB/AGE: October 20, 1985 31 y.o.  Admit date: 02/02/2017 Discharge date: 02/04/2017  Admission Diagnoses:n/v in pregnancy , Poor [facility]   Discharge Diagnoses: same ,AFI Active Problems:   Nausea and vomiting of pregnancy, antepartum   Discharged Condition: good  Hospital Course: admitted and given supportive care IVF and antinausea meds + anti-diarrheal meds . U/S placed at 33 +4 week  AFI 6 cm . Reassurinf fetal monitoring . Repeat U/S on 02/04/17 still shows AFI 6 cm   Pt is eating reg food on d/c  Of note she is s/p c/s x 3  Consults: None  Significant Diagnostic Studies: radiology:  Exam Information   Status Exam Begun  Exam Ended   Final [99] 02/04/2017 10:31 AM 02/04/2017 10:35 AM  PACS Images   Show images for US OB Limited  Study Result   CLINICAL DATA:  Oligohydramnios  EXAM: LIMITED OBSTETRIC ULTRASOUND  FINDINGS: Number of Fetuses: 1  Heart Rate:  127 bpm  Movement: Visualized  Presentation: Cephalic  Placental Location: Anterior  Previa: None  Amniotic Fluid (Subjective): Subjectively decreased, AFI 6.1 cm (2.5%ile equals 7.4 cm, 5%ile equals 8.3 cm, 95%ilerequals 24.5 cm.  FL:  6.53cm 33w  5d  MATERNAL FINDINGS:  Cervix:  Appears closed.  Uterus/Adnexae: No abnormality visualized.  IMPRESSION: Single viable intrauterine pregnancy at 33 weeks 5 days. Cephalic presentation.  Low AFI, similar to prior study.  This exam is performed on an emergent basis and does not comprehensively evaluate fetal size, dating, or anatomy; follow-up complete OB US should be considered if further fetal assessment is warranted.   Electronically Signed   By: Rolm Baptise M.D.   On: 02/04/2017 11:04      Results for orders placed or performed during the hospital encounter of 02/02/17 (from the past 72 hour(s))  Lipase, blood     Status: None   Collection Time: 02/02/17 11:07 AM   Result Value Ref Range   Lipase 27 11 - 51 U/L  Comprehensive metabolic panel     Status: Abnormal   Collection Time: 02/02/17 11:07 AM  Result Value Ref Range   Sodium 139 135 - 145 mmol/L   Potassium 3.3 (L) 3.5 - 5.1 mmol/L   Chloride 107 101 - 111 mmol/L   CO2 20 (L) 22 - 32 mmol/L   Glucose, Bld 121 (H) 65 - 99 mg/dL   BUN 7 6 - 20 mg/dL   Creatinine, Ser 0.69 0.44 - 1.00 mg/dL   Calcium 9.0 8.9 - 10.3 mg/dL   Total Protein 7.2 6.5 - 8.1 g/dL   Albumin 3.4 (L) 3.5 - 5.0 g/dL   AST 16 15 - 41 U/L   ALT 11 (L) 14 - 54 U/L   Alkaline Phosphatase 157 (H) 38 - 126 U/L   Total Bilirubin 0.6 0.3 - 1.2 mg/dL   GFR calc non Af Amer >60 >60 mL/min   GFR calc Af Amer >60 >60 mL/min    Comment: (NOTE) The eGFR has been calculated using the CKD EPI equation. This calculation has not been validated in all clinical situations. eGFR's persistently <60 mL/min signify possible Chronic Kidney Disease.    Anion gap 12 5 - 15  CBC     Status: Abnormal   Collection Time: 02/02/17 11:07 AM  Result Value Ref Range   WBC 27.6 (H) 3.6 - 11.0 K/uL   RBC 4.45 3.80 - 5.20 MIL/uL   Hemoglobin 13.2 12.0 - 16.0 g/dL  HCT 38.8 35.0 - 47.0 %   MCV 87.1 80.0 - 100.0 fL   MCH 29.7 26.0 - 34.0 pg   MCHC 34.1 32.0 - 36.0 g/dL   RDW 14.2 11.5 - 14.5 %   Platelets 366 150 - 440 K/uL  Magnesium     Status: None   Collection Time: 02/02/17 11:07 AM  Result Value Ref Range   Magnesium 1.8 1.7 - 2.4 mg/dL  TSH     Status: None   Collection Time: 02/02/17 11:07 AM  Result Value Ref Range   TSH 0.633 0.350 - 4.500 uIU/mL    Comment: Performed by a 3rd Generation assay with a functional sensitivity of <=0.01 uIU/mL.  Urinalysis, Complete w Microscopic     Status: Abnormal   Collection Time: 02/02/17  2:26 PM  Result Value Ref Range   Color, Urine AMBER (A) YELLOW    Comment: BIOCHEMICALS MAY BE AFFECTED BY COLOR   APPearance CLOUDY (A) CLEAR   Specific Gravity, Urine 1.030 1.005 - 1.030   pH 5.0 5.0  - 8.0   Glucose, UA NEGATIVE NEGATIVE mg/dL   Hgb urine dipstick NEGATIVE NEGATIVE   Bilirubin Urine NEGATIVE NEGATIVE   Ketones, ur 80 (A) NEGATIVE mg/dL   Protein, ur 100 (A) NEGATIVE mg/dL   Nitrite NEGATIVE NEGATIVE   Leukocytes, UA NEGATIVE NEGATIVE   RBC / HPF 0-5 0 - 5 RBC/hpf   WBC, UA 0-5 0 - 5 WBC/hpf   Bacteria, UA RARE (A) NONE SEEN   Squamous Epithelial / LPF 6-30 (A) NONE SEEN   Mucous PRESENT    Ca Oxalate Crys, UA PRESENT   Type and screen [facility]     Status: None   Collection Time: 02/02/17  8:09 PM  Result Value Ref Range   ABO/RH(D) O NEG    Antibody Screen NEG    Sample Expiration 02/05/2017   Urine Drug Screen, Qualitative ([facility] only)     Status: Abnormal   Collection Time: 02/02/17  8:25 PM  Result Value Ref Range   Tricyclic, Ur Screen NONE DETECTED NONE DETECTED   Amphetamines, Ur Screen NONE DETECTED NONE DETECTED   MDMA (Ecstasy)Ur Screen NONE DETECTED NONE DETECTED   Cocaine Metabolite,Ur [place] NONE DETECTED NONE DETECTED   Opiate, Ur Screen POSITIVE (A) NONE DETECTED   Phencyclidine (PCP) Ur S NONE DETECTED NONE DETECTED   Cannabinoid 50 Ng, Ur [place] NONE DETECTED NONE DETECTED   Barbiturates, Ur Screen NONE DETECTED NONE DETECTED   Benzodiazepine, Ur Scrn NONE DETECTED NONE DETECTED   Methadone Scn, Ur NONE DETECTED NONE DETECTED    Comment: (NOTE) 683  Tricyclics, urine               Cutoff 1000 ng/mL 200  Amphetamines, urine             Cutoff 1000 ng/mL 300  MDMA (Ecstasy), urine           Cutoff 500 ng/mL 400  Cocaine Metabolite, urine       Cutoff 300 ng/mL 500  Opiate, urine                   Cutoff 300 ng/mL 600  Phencyclidine (PCP), urine      Cutoff 25 ng/mL 700  Cannabinoid, urine              Cutoff 50 ng/mL 800  Barbiturates, urine             Cutoff 200 ng/mL 900  Benzodiazepine, urine           Cutoff 200 ng/mL 1000 Methadone, urine                Cutoff 300 ng/mL 1100 1200 The urine drug screen provides only a  preliminary, unconfirmed 1300 analytical test result and should not be used for non-medical 1400 purposes. Clinical consideration and professional judgment should 1500 be applied to any positive drug screen result due to possible 1600 interfering substances. A more specific alternate chemical method 1700 must be used in order to obtain a confirmed analytical result.  1800 Gas chromato graphy / mass spectrometry (GC/MS) is the preferred 1900 confirmatory method.   C difficile quick scan w PCR reflex     Status: None   Collection Time: 02/02/17  9:45 PM  Result Value Ref Range   C Diff antigen NEGATIVE NEGATIVE   C Diff toxin NEGATIVE NEGATIVE   C Diff interpretation No C. difficile detected.     Comment: VALID  Gastrointestinal Panel by PCR , Stool     Status: None   Collection Time: 02/02/17  9:45 PM  Result Value Ref Range   Campylobacter species NOT DETECTED NOT DETECTED   Plesimonas shigelloides NOT DETECTED NOT DETECTED   Salmonella species NOT DETECTED NOT DETECTED   Yersinia enterocolitica NOT DETECTED NOT DETECTED   Vibrio species NOT DETECTED NOT DETECTED   Vibrio cholerae NOT DETECTED NOT DETECTED   Enteroaggregative E coli (EAEC) NOT DETECTED NOT DETECTED   Enteropathogenic E coli (EPEC) NOT DETECTED NOT DETECTED   Enterotoxigenic E coli (ETEC) NOT DETECTED NOT DETECTED   Shiga like toxin producing E coli (STEC) NOT DETECTED NOT DETECTED   Shigella/Enteroinvasive E coli (EIEC) NOT DETECTED NOT DETECTED   Cryptosporidium NOT DETECTED NOT DETECTED   Cyclospora cayetanensis NOT DETECTED NOT DETECTED   Entamoeba histolytica NOT DETECTED NOT DETECTED   Giardia lamblia NOT DETECTED NOT DETECTED   Adenovirus F40/41 NOT DETECTED NOT DETECTED   Astrovirus NOT DETECTED NOT DETECTED   Norovirus GI/GII NOT DETECTED NOT DETECTED   Rotavirus A NOT DETECTED NOT DETECTED   Sapovirus (I, II, IV, and V) NOT DETECTED NOT DETECTED  Comprehensive metabolic panel     Status: Abnormal    Collection Time: 02/03/17  4:41 AM  Result Value Ref Range   Sodium 140 135 - 145 mmol/L   Potassium 3.2 (L) 3.5 - 5.1 mmol/L   Chloride 111 101 - 111 mmol/L   CO2 23 22 - 32 mmol/L   Glucose, Bld 117 (H) 65 - 99 mg/dL   BUN 6 6 - 20 mg/dL   Creatinine, Ser 0.62 0.44 - 1.00 mg/dL   Calcium 8.2 (L) 8.9 - 10.3 mg/dL   Total Protein 5.6 (L) 6.5 - 8.1 g/dL   Albumin 2.6 (L) 3.5 - 5.0 g/dL   AST 16 15 - 41 U/L   ALT 11 (L) 14 - 54 U/L   Alkaline Phosphatase 113 38 - 126 U/L   Total Bilirubin 0.4 0.3 - 1.2 mg/dL   GFR calc non Af Amer >60 >60 mL/min   GFR calc Af Amer >60 >60 mL/min    Comment: (NOTE) The eGFR has been calculated using the CKD EPI equation. This calculation has not been validated in all clinical situations. eGFR's persistently <60 mL/min signify possible Chronic Kidney Disease.    Anion gap 6 5 - 15  CBC     Status: Abnormal   Collection Time: 02/03/17  4:41 AM  Result  Value Ref Range   WBC 21.1 (H) 3.6 - 11.0 K/uL   RBC 3.68 (L) 3.80 - 5.20 MIL/uL   Hemoglobin 11.2 (L) 12.0 - 16.0 g/dL   HCT 33.0 (L) 35.0 - 47.0 %   MCV 89.7 80.0 - 100.0 fL   MCH 30.3 26.0 - 34.0 pg   MCHC 33.8 32.0 - 36.0 g/dL   RDW 14.2 11.5 - 14.5 %   Platelets 314 150 - 440 K/uL  Hepatitis B surface antigen     Status: None   Collection Time: 02/03/17 12:35 PM  Result Value Ref Range   Hepatitis B Surface Ag Negative Negative    Comment: (NOTE) Performed At: [facility] [place], [place] [id number] Lindon Romp MD [id number]   Rubella screen     Status: None   Collection Time: 02/03/17 12:35 PM  Result Value Ref Range   Rubella 1.99 Immune >0.99 index    Comment: (NOTE)                                Non-immune       <0.90                                Equivocal  0.90 - 0.99                                Immune           >0.99 Performed At: [facility] [place], [place] [id number] Lindon Romp MD [id number]   RPR      Status: None   Collection Time: 02/03/17 12:35 PM  Result Value Ref Range   RPR Ser Ql Non Reactive Non Reactive    Comment: (NOTE) Performed At: [facility] [address] [place], [place] [id number] Lindon Romp MD [id number]   Rapid HIV screen (HIV 1/2 Ab+Ag)     Status: None   Collection Time: 02/03/17 12:35 PM  Result Value Ref Range   HIV-1 P24 Antigen - HIV24 NON REACTIVE NON REACTIVE   HIV 1/2 Antibodies NON REACTIVE NON REACTIVE   Interpretation (HIV Ag Ab)      A non reactive test result means that HIV 1 or HIV 2 antibodies and HIV 1 p24 antigen were not detected in the specimen.  Varicella zoster antibody, IgG     Status: Abnormal   Collection Time: 02/03/17 12:35 PM  Result Value Ref Range   Varicella IgG <135 (L) Immune >165 index    Comment: (NOTE)                               Negative          <135                               Equivocal    135 - 165                               Positive          >165 A positive result generally indicates exposure to the pathogen or administration of  specific immunoglobulins, but it is not indication of active infection or stage of disease. Performed At: [facility] [place], [place] [id number] Lindon Romp MD [id number]   Glucose, fasting gestational     Status: None   Collection Time: 02/03/17 12:35 PM  Result Value Ref Range   Glucose, Fasting-Gestational 105 mg/dL  CBC     Status: Abnormal   Collection Time: 02/04/17  4:04 AM  Result Value Ref Range   WBC 19.8 (H) 3.6 - 11.0 K/uL   RBC 3.43 (L) 3.80 - 5.20 MIL/uL   Hemoglobin 10.6 (L) 12.0 - 16.0 g/dL   HCT 30.9 (L) 35.0 - 47.0 %   MCV 90.2 80.0 - 100.0 fL   MCH 31.0 26.0 - 34.0 pg   MCHC 34.4 32.0 - 36.0 g/dL   RDW 14.5 11.5 - 14.5 %   Platelets 298 150 - 440 K/uL  Glucose, capillary     Status: None   Collection Time: 02/04/17  9:16 AM  Result Value Ref Range   Glucose-Capillary 86 65 - 99 mg/dL  Glucose,  capillary     Status: Abnormal   Collection Time: 02/04/17 11:18 AM  Result Value Ref Range   Glucose-Capillary 134 (H) 65 - 99 mg/dL  Glucose, capillary     Status: None   Collection Time: 02/04/17  2:54 PM  Result Value Ref Range   Glucose-Capillary 76 65 - 99 mg/dL   Results for orders placed or performed during the hospital encounter of 02/02/17 (from the past 24 hour(s))  CBC     Status: Abnormal   Collection Time: 02/04/17  4:04 AM  Result Value Ref Range   WBC 19.8 (H) 3.6 - 11.0 K/uL   RBC 3.43 (L) 3.80 - 5.20 MIL/uL   Hemoglobin 10.6 (L) 12.0 - 16.0 g/dL   HCT 30.9 (L) 35.0 - 47.0 %   MCV 90.2 80.0 - 100.0 fL   MCH 31.0 26.0 - 34.0 pg   MCHC 34.4 32.0 - 36.0 g/dL   RDW 14.5 11.5 - 14.5 %   Platelets 298 150 - 440 K/uL  Glucose, capillary     Status: None   Collection Time: 02/04/17  9:16 AM  Result Value Ref Range   Glucose-Capillary 86 65 - 99 mg/dL  Glucose, capillary     Status: Abnormal   Collection Time: 02/04/17 11:18 AM  Result Value Ref Range   Glucose-Capillary 134 (H) 65 - 99 mg/dL  Glucose, capillary     Status: None   Collection Time: 02/04/17  2:54 PM  Result Value Ref Range   Glucose-Capillary 76 65 - 99 mg/dL    Treatments: IV hydration  Discharge Exam: Blood pressure 115/75, pulse 85, temperature 98.7 F (37.1 C), temperature source Oral, resp. rate 20, height '5\' 2"'  (1.575 m), weight 77.1 kg (170 lb), last menstrual period 11/27/2016, SpO2 100 %, unknown if currently breastfeeding. General appearance: alert and cooperative Resp: clear to auscultation bilaterally Cardio: regular rate and rhythm, S1, S2 normal, no murmur, click, rub or gallop GI: soft, non-tender; bowel sounds normal; no masses,  no organomegaly  Disposition: 01-Home or Self Care  Discharge Instructions    Call MD for:    Complete by:  As directed    Decreased fetal movt   Call MD for:  difficulty breathing, headache or visual disturbances    Complete by:  As directed     Call MD for:  extreme fatigue    Complete by:  As  directed    Call MD for:  hives    Complete by:  As directed    Call MD for:  persistant dizziness or light-headedness    Complete by:  As directed    Call MD for:  persistant nausea and vomiting    Complete by:  As directed    Call MD for:  redness, tenderness, or signs of infection (pain, swelling, redness, odor or green/yellow discharge around incision site)    Complete by:  As directed    Call MD for:  severe uncontrolled pain    Complete by:  As directed    Call MD for:  temperature >100.4    Complete by:  As directed    Diet - low sodium heart healthy    Complete by:  As directed    Increase activity slowly    Complete by:  As directed      Allergies as of 02/04/2017   No Known Allergies     Medication List    STOP taking these medications   cefUROXime 500 MG tablet Commonly known as:  CEFTIN   HYDROcodone-acetaminophen 5-325 MG tablet Commonly known as:  NORCO/VICODIN   hydrOXYzine 50 MG tablet Commonly known as:  ATARAX/VISTARIL   methylPREDNISolone 4 MG Tbpk tablet Commonly known as:  MEDROL DOSEPAK   traMADol 50 MG tablet Commonly known as:  ULTRAM     TAKE these medications   nicotine 21 mg/24hr patch Commonly known as:  NICODERM CQ - dosed in mg/24 hours Place 1 patch (21 mg total) onto the skin daily at 8 pm.   ondansetron 4 MG tablet Commonly known as:  ZOFRAN Take 1 tablet (4 mg total) by mouth every 6 (six) hours as needed for nausea.   promethazine 12.5 MG tablet Commonly known as:  PHENERGAN Take 1 tablet (12.5 mg total) by mouth every 4 (four) hours as needed for nausea or vomiting.   TYLENOL 500 MG tablet Generic drug:  acetaminophen Take 1,000 mg by mouth every 6 (six) hours as needed.      Follow-up Information    Schermerhorn, Gwen Her, MD Follow up.   Specialty:  Obstetrics and Gynecology Why:  needs appt with TJS  with NST and AFI at [facility]  call tomorrow to scheduled  Contact  information: [address] [place] [address] [phone]           Signed: Gwen Her Schermerhorn 02/04/2017, 8:02 PM

## 2017-02-04 NOTE — Discharge Instructions (Signed)
[facility] [phone]  [facility] [phone] or [phone]

## 2017-02-04 NOTE — Progress Notes (Addendum)
BS done before discharge = 98 at 2125

## 2017-02-05 ENCOUNTER — Inpatient Hospital Stay
Admission: RE | Admit: 2017-02-05 | Discharge: 2017-02-05 | Disposition: A | Discharge status: Home / Self Care | Payer: Medicaid Other | Attending: Obstetrics and Gynecology | Admitting: Obstetrics and Gynecology

## 2017-02-05 DIAGNOSIS — Z3A00 Weeks of gestation of pregnancy not specified: Secondary | ICD-10-CM | POA: Diagnosis not present

## 2017-02-05 LAB — GLUCOSE, CAPILLARY: GLUCOSE-CAPILLARY: 98 mg/dL (ref 65–99)

## 2017-02-05 MED ORDER — BETAMETHASONE SOD PHOS & ACET 6 (3-3) MG/ML IJ SUSP
12.0000 mg | Freq: Once | INTRAMUSCULAR | Status: AC
  Administered 2017-02-05: 12 mg via INTRAMUSCULAR

## 2017-02-05 NOTE — Progress Notes (Signed)
Pt arrived to unit for scheduled BMZ inj #2, injection given. Pt tolerating well. Denies any complaints or concerns.

## 2017-02-09 ENCOUNTER — Other Ambulatory Visit: Payer: Self-pay | Admitting: Obstetrics and Gynecology

## 2017-03-11 NOTE — H&P (Signed)
OB History & Physical   History of Present Illness:  Chief Complaint:  Scheduled CS  HPI:  Gabriela Woodard is a 31 y.o. G38P2002 female at 6 weeks dated by 33 week ultrasound with EDC of 03/19/17 .  She presents to L&D for scheduled repeat cesarean with tubal ligation  +FM, no CTX, no LOF, no VB  Pregnancy Issues: 1. Late to care (33wks), insufficient care following 2. History of cesarean currently pregnancy (x3) 3. Oligohydramnnios - resolved 4. Smoker 5. Varicella non-immune 6. Rh negative, did not get Rhogam  Maternal Medical History:   Past Medical History:  Diagnosis Date  . Seizures (HCC)     Past Surgical History:  Procedure Laterality Date  . CESAREAN SECTION      No Known Allergies  Prior to Admission medications   Not on File     Prenatal care site: [facility]   Social History: She  reports that she has been smoking Cigarettes.  She has never used smokeless tobacco. She reports that she does not drink alcohol or use drugs.  Family History: family history includes Cancer in her maternal grandmother and other; Diabetes Mellitus II in her paternal grandmother.   Review of Systems: A full review of systems was performed and negative except as noted in the HPI.     Physical Exam:  Vital Signs: LMP 11/27/2016 (Exact Date)  General: no acute distress.  HEENT: normocephalic, atraumatic Heart: regular rate & rhythm.  No murmurs/rubs/gallops Lungs: clear to auscultation bilaterally, normal respiratory effort Abdomen: soft, gravid, non-tender;  EFW: 7.9 Pelvic:   External: Normal external female genitalia  Cervix: deferred   Extremities: non-tender, symmetric, mild edema bilaterally.  DTRs: 2+  Neurologic: Alert & oriented x 3.     Pertinent Results:  Prenatal Labs: Blood type/Rh O neg  Antibody screen neg  Rubella Immune  Varicella Non-Immune  RPR NR  HBsAg Neg  HIV NR  GC neg  Chlamydia neg  Genetic screening negative  1 hour GTT Not  done  3 hour GTT Not done  GBS Not done    Cephalic by ultrasound   Assessment:  Gabriela Woodard is a 31 y.o. G29P2002 female at 7 weeks with scheduled cesarean   Plan:  1. Admit to Labor & Delivery 2. CBC, T&S, NPO, IVF 3. GBS  unknown 4. Consents obtained. 5. Continuous efm/toco until OR 6. Prepare OR; will go when ready.   7. Confirmed desired BTL.  ----- Ranae Plumber, MD Attending Obstetrician and Gynecologist [facility], Department of OB/GYN [facility]

## 2017-03-12 ENCOUNTER — Encounter
Admission: RE | Admit: 2017-03-12 | Discharge: 2017-03-12 | Disposition: A | Discharge status: Home / Self Care | Payer: Medicaid Other | Source: Ambulatory Visit | Attending: Obstetrics & Gynecology | Admitting: Obstetrics & Gynecology

## 2017-03-12 LAB — TYPE AND SCREEN
ABO/RH(D): O NEG
Antibody Screen: NEGATIVE
Extend sample reason: UNDETERMINED

## 2017-03-12 LAB — RAPID HIV SCREEN (HIV 1/2 AB+AG)
HIV 1/2 ANTIBODIES: NONREACTIVE
HIV-1 P24 Antigen - HIV24: NONREACTIVE

## 2017-03-12 LAB — CBC
HEMATOCRIT: 38.2 % (ref 35.0–47.0)
Hemoglobin: 12.9 g/dL (ref 12.0–16.0)
MCH: 30 pg (ref 26.0–34.0)
MCHC: 33.8 g/dL (ref 32.0–36.0)
MCV: 88.7 fL (ref 80.0–100.0)
PLATELETS: 376 10*3/uL (ref 150–440)
RBC: 4.31 MIL/uL (ref 3.80–5.20)
RDW: 15 % — AB (ref 11.5–14.5)
WBC: 21.9 10*3/uL — AB (ref 3.6–11.0)

## 2017-03-12 NOTE — Patient Instructions (Signed)
  Your procedure is scheduled OZ:HYQM 21 , 2018. Report to Emergency room no latter then 6:00 am.   Remember: Instructions that are not followed completely may result in serious medical risk, up to and including death, or upon the discretion of your surgeon and anesthesiologist your surgery may need to be rescheduled.    _x___ 1. Do not eat food after midnight night prior to surgery. No gum chewing or hard candies.        ____ 2. No Alcohol for 24 hours before or after surgery.   ____ 3. Bring all medications with you on the day of surgery if instructed.    __x__ 4. Notify your doctor if there is any change in your medical condition     (cold, fever, infections).    __x___ 5. No smoking 24 hours prior to surgery.     Do not wear jewelry, make-up, hairpins, clips or nail polish.  Do not wear lotions, powders, or perfumes.   Do not shave 48 hours prior to surgery. Men may shave face and neck.  Do not bring valuables to the hospital.    [facility] is not responsible for any belongings or valuables.               Contacts, dentures or bridgework may not be worn into surgery.  Leave your suitcase in the car. After surgery it may be brought to your room.  For patients admitted to the hospital, discharge time is determined by your treatment team.   Patients discharged the day of surgery will not be allowed to drive home.    Please read over the following fact sheets that you were given:   [facility] Preparing for Surgery  ____ Take these medicines the morning of surgery with A SIP OF WATER: NONE    ____ Fleet Enema (as directed)   _x___ Use CHG Soap as directed on instruction sheet  ____ Use inhalers on the day of surgery and bring to hospital day of surgery  ____ Stop metformin 2 days prior to surgery    ____ Take 1/2 of usual insulin dose the night before surgery and none on the morning of surgery.   ____ Stop Eliquis/Coumadin/Plavix/aspirin on does not apply  _x___ Stop  Anti-inflammatories such as Advil, Aleve, Ibuprofen, Motrin, Naproxen,  Naprosyn, Goodies powders or aspirin products. OK to take Tylenol.   ____ Stop supplements until after surgery.    ____ Bring C-Pap to the hospital.

## 2017-03-13 ENCOUNTER — Inpatient Hospital Stay
Admission: RE | Admit: 2017-03-13 | Discharge: 2017-03-16 | DRG: 766 | Disposition: A | Discharge status: Home / Self Care | Payer: Medicaid Other | Source: Ambulatory Visit | Attending: Obstetrics & Gynecology | Admitting: Obstetrics & Gynecology

## 2017-03-13 ENCOUNTER — Encounter: Payer: Self-pay | Admitting: Anesthesiology

## 2017-03-13 ENCOUNTER — Inpatient Hospital Stay: Payer: Medicaid Other | Admitting: Anesthesiology

## 2017-03-13 ENCOUNTER — Encounter
Admission: RE | Disposition: A | Discharge status: Home / Self Care | Payer: Self-pay | Source: Ambulatory Visit | Attending: Obstetrics & Gynecology

## 2017-03-13 DIAGNOSIS — Z3A39 39 weeks gestation of pregnancy: Secondary | ICD-10-CM | POA: Diagnosis not present

## 2017-03-13 DIAGNOSIS — O99334 Smoking (tobacco) complicating childbirth: Secondary | ICD-10-CM | POA: Diagnosis present

## 2017-03-13 DIAGNOSIS — F17210 Nicotine dependence, cigarettes, uncomplicated: Secondary | ICD-10-CM | POA: Diagnosis present

## 2017-03-13 DIAGNOSIS — O0993 Supervision of high risk pregnancy, unspecified, third trimester: Secondary | ICD-10-CM

## 2017-03-13 DIAGNOSIS — O34211 Maternal care for low transverse scar from previous cesarean delivery: Secondary | ICD-10-CM | POA: Diagnosis present

## 2017-03-13 DIAGNOSIS — Z302 Encounter for sterilization: Secondary | ICD-10-CM | POA: Diagnosis not present

## 2017-03-13 DIAGNOSIS — O26893 Other specified pregnancy related conditions, third trimester: Secondary | ICD-10-CM | POA: Diagnosis present

## 2017-03-13 DIAGNOSIS — Z6791 Unspecified blood type, Rh negative: Secondary | ICD-10-CM | POA: Diagnosis not present

## 2017-03-13 DIAGNOSIS — O0930 Supervision of pregnancy with insufficient antenatal care, unspecified trimester: Secondary | ICD-10-CM

## 2017-03-13 LAB — URINE DRUG SCREEN, QUALITATIVE (ARMC ONLY)
Amphetamines, Ur Screen: NOT DETECTED
BARBITURATES, UR SCREEN: NOT DETECTED
Benzodiazepine, Ur Scrn: NOT DETECTED
CANNABINOID 50 NG, UR ~~LOC~~: NOT DETECTED
COCAINE METABOLITE, UR ~~LOC~~: NOT DETECTED
MDMA (Ecstasy)Ur Screen: NOT DETECTED
METHADONE SCREEN, URINE: NOT DETECTED
OPIATE, UR SCREEN: NOT DETECTED
PHENCYCLIDINE (PCP) UR S: NOT DETECTED
Tricyclic, Ur Screen: NOT DETECTED

## 2017-03-13 LAB — RPR: RPR Ser Ql: NONREACTIVE

## 2017-03-13 SURGERY — Surgical Case
Anesthesia: Spinal | Laterality: Bilateral

## 2017-03-13 MED ORDER — BUPIVACAINE HCL (PF) 0.5 % IJ SOLN
30.0000 mL | Freq: Once | INTRAMUSCULAR | Status: DC
  Filled 2017-03-13: qty 30

## 2017-03-13 MED ORDER — LIDOCAINE HCL (PF) 2 % IJ SOLN
INTRAMUSCULAR | Status: AC
  Filled 2017-03-13: qty 4

## 2017-03-13 MED ORDER — MENTHOL 3 MG MT LOZG
1.0000 | LOZENGE | OROMUCOSAL | Status: DC | PRN
  Filled 2017-03-13: qty 9

## 2017-03-13 MED ORDER — BUPIVACAINE HCL 0.5 % IJ SOLN
INTRAMUSCULAR | Status: DC | PRN
  Administered 2017-03-13: 30 mL

## 2017-03-13 MED ORDER — NALOXONE HCL 0.4 MG/ML IJ SOLN: 0.4000 mg | INTRAMUSCULAR | Status: DC | PRN

## 2017-03-13 MED ORDER — FENTANYL CITRATE (PF) 100 MCG/2ML IJ SOLN: 25.0000 ug | INTRAMUSCULAR | Status: DC | PRN

## 2017-03-13 MED ORDER — MIDAZOLAM HCL 2 MG/2ML IJ SOLN
INTRAMUSCULAR | Status: AC
  Filled 2017-03-13: qty 2

## 2017-03-13 MED ORDER — OXYCODONE HCL 5 MG PO TABS
10.0000 mg | ORAL_TABLET | ORAL | Status: DC | PRN
  Administered 2017-03-14 – 2017-03-16 (×11): 10 mg via ORAL
  Filled 2017-03-13 (×12): qty 2

## 2017-03-13 MED ORDER — ACETAMINOPHEN 650 MG RE SUPP
650.0000 mg | Freq: Once | RECTAL | Status: DC
  Filled 2017-03-13 (×2): qty 1

## 2017-03-13 MED ORDER — SENNOSIDES-DOCUSATE SODIUM 8.6-50 MG PO TABS
2.0000 | ORAL_TABLET | ORAL | Status: DC
  Administered 2017-03-14 – 2017-03-16 (×3): 2 via ORAL
  Filled 2017-03-13 (×3): qty 2

## 2017-03-13 MED ORDER — PHENYLEPHRINE HCL 10 MG/ML IJ SOLN
INTRAMUSCULAR | Status: AC
  Filled 2017-03-13: qty 1

## 2017-03-13 MED ORDER — BUPIVACAINE IN DEXTROSE 0.75-8.25 % IT SOLN
INTRATHECAL | Status: AC
  Filled 2017-03-13: qty 2

## 2017-03-13 MED ORDER — NALBUPHINE HCL 10 MG/ML IJ SOLN: 5.0000 mg | INTRAMUSCULAR | Status: DC | PRN

## 2017-03-13 MED ORDER — MEPERIDINE HCL 25 MG/ML IJ SOLN
6.2500 mg | INTRAMUSCULAR | Status: DC | PRN
  Filled 2017-03-13: qty 1

## 2017-03-13 MED ORDER — BUPIVACAINE IN DEXTROSE 0.75-8.25 % IT SOLN
INTRATHECAL | Status: DC | PRN
  Administered 2017-03-13: 1.6 mL via INTRATHECAL

## 2017-03-13 MED ORDER — OXYTOCIN 40 UNITS IN LACTATED RINGERS INFUSION - SIMPLE MED
INTRAVENOUS | Status: DC | PRN
  Administered 2017-03-13: 1000 mL via INTRAVENOUS

## 2017-03-13 MED ORDER — OXYCODONE HCL 5 MG PO TABS: 5.0000 mg | ORAL_TABLET | ORAL | Status: DC | PRN

## 2017-03-13 MED ORDER — SIMETHICONE 80 MG PO CHEW
160.0000 mg | CHEWABLE_TABLET | Freq: Four times a day (QID) | ORAL | Status: DC | PRN
  Administered 2017-03-14 – 2017-03-16 (×3): 160 mg via ORAL
  Filled 2017-03-13 (×3): qty 2

## 2017-03-13 MED ORDER — WITCH HAZEL-GLYCERIN EX PADS: 1.0000 "application " | MEDICATED_PAD | CUTANEOUS | Status: DC | PRN

## 2017-03-13 MED ORDER — FENTANYL CITRATE (PF) 100 MCG/2ML IJ SOLN
INTRAMUSCULAR | Status: DC | PRN
Start: 2017-03-13 — End: 2017-03-13
  Administered 2017-03-13 (×2): 50 ug via INTRAVENOUS

## 2017-03-13 MED ORDER — SODIUM CHLORIDE 0.9 % IJ SOLN
INTRAMUSCULAR | Status: AC
  Filled 2017-03-13: qty 50

## 2017-03-13 MED ORDER — EPHEDRINE SULFATE-NACL 50-0.9 MG/10ML-% IV SOSY
PREFILLED_SYRINGE | INTRAVENOUS | Status: DC | PRN
  Administered 2017-03-13: 5 mg via INTRAVENOUS

## 2017-03-13 MED ORDER — ONDANSETRON HCL 4 MG/2ML IJ SOLN: 4.0000 mg | Freq: Three times a day (TID) | INTRAMUSCULAR | Status: DC | PRN

## 2017-03-13 MED ORDER — SOD CITRATE-CITRIC ACID 500-334 MG/5ML PO SOLN
30.0000 mL | ORAL | Status: AC
  Administered 2017-03-13: 30 mL via ORAL
  Filled 2017-03-13: qty 15

## 2017-03-13 MED ORDER — CEFAZOLIN SODIUM-DEXTROSE 2-4 GM/100ML-% IV SOLN
2.0000 g | INTRAVENOUS | Status: AC
  Administered 2017-03-13: 2 g via INTRAVENOUS
  Filled 2017-03-13: qty 100

## 2017-03-13 MED ORDER — LACTATED RINGERS IV SOLN: INTRAVENOUS | Status: DC

## 2017-03-13 MED ORDER — ONDANSETRON HCL 4 MG/2ML IJ SOLN
INTRAMUSCULAR | Status: AC
  Filled 2017-03-13: qty 2

## 2017-03-13 MED ORDER — LACTATED RINGERS IV SOLN
INTRAVENOUS | Status: DC
  Administered 2017-03-13 (×2): via INTRAVENOUS

## 2017-03-13 MED ORDER — DIPHENHYDRAMINE HCL 25 MG PO CAPS: 25.0000 mg | ORAL_CAPSULE | Freq: Four times a day (QID) | ORAL | Status: DC | PRN

## 2017-03-13 MED ORDER — ROCURONIUM BROMIDE 50 MG/5ML IV SOLN
INTRAVENOUS | Status: AC
  Filled 2017-03-13: qty 1

## 2017-03-13 MED ORDER — TETANUS-DIPHTH-ACELL PERTUSSIS 5-2.5-18.5 LF-MCG/0.5 IM SUSP
0.5000 mL | Freq: Once | INTRAMUSCULAR | Status: DC
  Filled 2017-03-13: qty 0.5

## 2017-03-13 MED ORDER — KETOROLAC TROMETHAMINE 30 MG/ML IJ SOLN
30.0000 mg | Freq: Four times a day (QID) | INTRAMUSCULAR | Status: AC | PRN
  Administered 2017-03-13: 30 mg via INTRAVENOUS
  Filled 2017-03-13: qty 1

## 2017-03-13 MED ORDER — MIDAZOLAM HCL 2 MG/2ML IJ SOLN
INTRAMUSCULAR | Status: DC | PRN
  Administered 2017-03-13: 1 mg via INTRAVENOUS

## 2017-03-13 MED ORDER — METHYLENE BLUE 0.5 % INJ SOLN
INTRAVENOUS | Status: AC
  Filled 2017-03-13: qty 10

## 2017-03-13 MED ORDER — SODIUM CHLORIDE 0.9% FLUSH
3.0000 mL | Freq: Two times a day (BID) | INTRAVENOUS | Status: DC
  Administered 2017-03-14: 3 mL via INTRAVENOUS

## 2017-03-13 MED ORDER — OXYTOCIN 40 UNITS IN LACTATED RINGERS INFUSION - SIMPLE MED
INTRAVENOUS | Status: AC
  Filled 2017-03-13: qty 1000

## 2017-03-13 MED ORDER — MORPHINE SULFATE (PF) 0.5 MG/ML IJ SOLN
INTRAMUSCULAR | Status: DC | PRN
  Administered 2017-03-13: .2 mg via EPIDURAL
  Administered 2017-03-13: 1 mg via INTRAVENOUS

## 2017-03-13 MED ORDER — SUCCINYLCHOLINE CHLORIDE 20 MG/ML IJ SOLN
INTRAMUSCULAR | Status: AC
  Filled 2017-03-13: qty 1

## 2017-03-13 MED ORDER — SODIUM CHLORIDE 0.9 % IV SOLN: 250.0000 mL | INTRAVENOUS | Status: DC

## 2017-03-13 MED ORDER — FENTANYL CITRATE (PF) 100 MCG/2ML IJ SOLN
INTRAMUSCULAR | Status: AC
  Filled 2017-03-13: qty 2

## 2017-03-13 MED ORDER — DIPHENHYDRAMINE HCL 25 MG PO CAPS: 25.0000 mg | ORAL_CAPSULE | ORAL | Status: DC | PRN

## 2017-03-13 MED ORDER — EPHEDRINE SULFATE 50 MG/ML IJ SOLN
INTRAMUSCULAR | Status: AC
  Filled 2017-03-13: qty 1

## 2017-03-13 MED ORDER — MORPHINE SULFATE (PF) 0.5 MG/ML IJ SOLN
INTRAMUSCULAR | Status: AC
  Filled 2017-03-13: qty 10

## 2017-03-13 MED ORDER — BUPIVACAINE LIPOSOME 1.3 % IJ SUSP
20.0000 mL | Freq: Once | INTRAMUSCULAR | Status: DC
  Filled 2017-03-13: qty 20

## 2017-03-13 MED ORDER — ACETAMINOPHEN 500 MG PO TABS
1000.0000 mg | ORAL_TABLET | Freq: Four times a day (QID) | ORAL | Status: AC
  Administered 2017-03-13 – 2017-03-14 (×2): 1000 mg via ORAL
  Filled 2017-03-13 (×2): qty 2

## 2017-03-13 MED ORDER — PRENATAL MULTIVITAMIN CH
1.0000 | ORAL_TABLET | Freq: Every day | ORAL | Status: DC
  Administered 2017-03-13 – 2017-03-15 (×3): 1 via ORAL
  Filled 2017-03-13 (×4): qty 1

## 2017-03-13 MED ORDER — ACETAMINOPHEN 500 MG PO TABS
1000.0000 mg | ORAL_TABLET | Freq: Four times a day (QID) | ORAL | Status: DC
  Administered 2017-03-14 – 2017-03-16 (×8): 1000 mg via ORAL
  Filled 2017-03-13 (×5): qty 2

## 2017-03-13 MED ORDER — INFLUENZA VAC SPLIT QUAD 0.5 ML IM SUSY: 0.5000 mL | PREFILLED_SYRINGE | INTRAMUSCULAR | Status: DC | PRN

## 2017-03-13 MED ORDER — ONDANSETRON HCL 4 MG/2ML IJ SOLN: 4.0000 mg | Freq: Once | INTRAMUSCULAR | Status: DC | PRN

## 2017-03-13 MED ORDER — SODIUM CHLORIDE 0.9 % IV SOLN
INTRAVENOUS | Status: DC | PRN
  Administered 2017-03-13: 70 mL

## 2017-03-13 MED ORDER — IBUPROFEN 600 MG PO TABS
600.0000 mg | ORAL_TABLET | Freq: Four times a day (QID) | ORAL | Status: DC
  Administered 2017-03-14 – 2017-03-16 (×10): 600 mg via ORAL
  Filled 2017-03-13 (×11): qty 1

## 2017-03-13 MED ORDER — OXYTOCIN 40 UNITS IN LACTATED RINGERS INFUSION - SIMPLE MED
2.5000 [IU]/h | INTRAVENOUS | Status: AC
  Filled 2017-03-13: qty 1000

## 2017-03-13 MED ORDER — OXYTOCIN 10 UNIT/ML IJ SOLN
INTRAMUSCULAR | Status: AC
  Filled 2017-03-13: qty 4

## 2017-03-13 MED ORDER — ONDANSETRON HCL 4 MG/2ML IJ SOLN
INTRAMUSCULAR | Status: DC | PRN
  Administered 2017-03-13: 8 mg via INTRAVENOUS

## 2017-03-13 MED ORDER — SODIUM CHLORIDE 0.9% FLUSH: 3.0000 mL | INTRAVENOUS | Status: DC | PRN

## 2017-03-13 MED ORDER — KETOROLAC TROMETHAMINE 30 MG/ML IJ SOLN: 30.0000 mg | Freq: Four times a day (QID) | INTRAMUSCULAR | Status: AC | PRN

## 2017-03-13 MED ORDER — SODIUM CHLORIDE 0.9 % IV SOLN
INTRAVENOUS | Status: DC | PRN
  Administered 2017-03-13: 50 ug/min via INTRAVENOUS

## 2017-03-13 MED ORDER — COCONUT OIL OIL: 1.0000 "application " | TOPICAL_OIL | Status: DC | PRN

## 2017-03-13 MED ORDER — DIPHENHYDRAMINE HCL 50 MG/ML IJ SOLN
12.5000 mg | INTRAMUSCULAR | Status: DC | PRN
Start: 2017-03-13 — End: 2017-03-16

## 2017-03-13 MED ORDER — DIBUCAINE 1 % RE OINT: 1.0000 "application " | TOPICAL_OINTMENT | RECTAL | Status: DC | PRN

## 2017-03-13 SURGICAL SUPPLY — 34 items
CANISTER SUCT 3000ML PPV (MISCELLANEOUS) ×3 IMPLANT
CLOSURE WOUND 1/2 X4 (GAUZE/BANDAGES/DRESSINGS) ×1
DERMABOND ADVANCED (GAUZE/BANDAGES/DRESSINGS) ×2
DERMABOND ADVANCED .7 DNX12 (GAUZE/BANDAGES/DRESSINGS) ×1 IMPLANT
DRSG TELFA 3X8 NADH (GAUZE/BANDAGES/DRESSINGS) ×3 IMPLANT
ELECT CAUTERY BLADE 6.4 (BLADE) ×3 IMPLANT
ELECT REM PT RETURN 9FT ADLT (ELECTROSURGICAL) ×3
ELECTRODE REM PT RTRN 9FT ADLT (ELECTROSURGICAL) ×1 IMPLANT
GAUZE SPONGE 4X4 12PLY STRL (GAUZE/BANDAGES/DRESSINGS) ×3 IMPLANT
GLOVE PI ORTHOPRO 6.5 (GLOVE) ×8
GLOVE PI ORTHOPRO STRL 6.5 (GLOVE) ×4 IMPLANT
GLOVE SURG SYN 6.5 ES PF (GLOVE) ×3 IMPLANT
GOWN STRL REUS W/ TWL LRG LVL3 (GOWN DISPOSABLE) ×3 IMPLANT
GOWN STRL REUS W/TWL LRG LVL3 (GOWN DISPOSABLE) ×6
NS IRRIG 1000ML POUR BTL (IV SOLUTION) ×3 IMPLANT
PACK C SECTION AR (MISCELLANEOUS) ×3 IMPLANT
PAD OB MATERNITY 4.3X12.25 (PERSONAL CARE ITEMS) ×3 IMPLANT
PAD PREP 24X41 OB/GYN DISP (PERSONAL CARE ITEMS) ×12 IMPLANT
STRAP SAFETY BODY (MISCELLANEOUS) ×3 IMPLANT
STRIP CLOSURE SKIN 1/2X4 (GAUZE/BANDAGES/DRESSINGS) ×2 IMPLANT
SUT MNCRL 4-0 (SUTURE) ×4
SUT MNCRL 4-0 27XMFL (SUTURE) ×2
SUT PDS AB 1 TP1 96 (SUTURE) ×3 IMPLANT
SUT PLAIN 2 0 XLH (SUTURE) ×3 IMPLANT
SUT VIC AB 0 CT1 36 (SUTURE) ×6 IMPLANT
SUT VIC AB 2-0 CT1 27 (SUTURE) ×2
SUT VIC AB 2-0 CT1 TAPERPNT 27 (SUTURE) ×1 IMPLANT
SUT VIC AB 2-0 SH 27 (SUTURE) ×4
SUT VIC AB 2-0 SH 27XBRD (SUTURE) ×2 IMPLANT
SUT VIC AB 3-0 SH 27 (SUTURE)
SUT VIC AB 3-0 SH 27X BRD (SUTURE) IMPLANT
SUT VICRYL 3-0 CR8 SH (SUTURE) ×3 IMPLANT
SUTURE MNCRL 4-0 27XMF (SUTURE) ×2 IMPLANT
SWABSTK COMLB BENZOIN TINCTURE (MISCELLANEOUS) ×3 IMPLANT

## 2017-03-13 NOTE — Progress Notes (Signed)
Awaiting resi;ts of UDS before proceeding with c/s.

## 2017-03-13 NOTE — Anesthesia Procedure Notes (Signed)
Spinal  Patient location during procedure: OR Staffing Anesthesiologist: Chandon Lazcano Performed: anesthesiologist  Preanesthetic Checklist Completed: patient identified, site marked, surgical consent, pre-op evaluation, timeout performed, IV checked and risks and benefits discussed Spinal Block Patient position: sitting Prep: Betadine Patient monitoring: heart rate, cardiac monitor, continuous pulse ox and blood pressure Approach: midline Location: L3-4 Injection technique: single-shot Needle Needle type: Pencil-Tip  Needle gauge: 25 G Needle length: 9 cm Assessment Sensory level: T10     

## 2017-03-13 NOTE — Discharge Summary (Signed)
Obstetrical Discharge Summary  Patient Name: Gabriela Woodard DOB: 1985/09/25 MRN: [id number]  Date of Admission: 03/13/2017 Date of Delivery: 03/13/17 Delivered by: Gabriela Plumber, MD Date of Discharge: 03/13/2017  Primary OB: [facility]  WNU:[id number] last menstrual period was 11/27/2016 (exact date). EDC Estimated Date of Delivery: 03/19/17 Gestational Age at Delivery: [redacted]w[redacted]d   Antepartum complications:  Pregnancy Issues: 1. Late to care (33wks), insufficient care following 2. History of cesarean currently pregnancy (x3) 3. Oligohydramnnios - resolved 4. Smoker 5. Varicella non-immune 6. Rh negative, did not get Rhogam  Admitting Diagnosis: history of prior cesarean, term pregnancy [id number] Secondary Diagnosis: Patient Active Problem List   Diagnosis Date Noted  . Delivery by elective cesarean section 03/13/2017  . Smoking (tobacco) complicating childbirth 03/13/2017  . Insufficient prenatal care 03/13/2017  . Supervision of high risk pregnancy in third trimester 03/13/2017  . H/O gestational diabetes in prior pregnancy, currently pregnant 11/29/2012  . Rh negative state in antepartum period 10/26/2012  . Previous cesarean section 10/25/2012    Complications: None Intrapartum complications/course: scheduled cesarean Date of Delivery:  03/13/17 Delivered By: Gabriela Woodard Delivery Type: repeat cesarean section, low transverse incision Anesthesia: spinal Placenta: expressed Newborn Data: Live born female  Birth Weight: 5 lb 9.2 oz (2530 g) APGAR: 9, 9  Postpartum Procedures: None  Post partum course:    Patient had an uncomplicated postpartum course.  By time of discharge on POD#3, her pain was controlled on oral pain medications; she had appropriate lochia and was ambulating, voiding without difficulty, tolerating regular diet and passing flatus.   She was deemed stable for discharge to home.    Discharge Physical Exam:  BP (!) 141/97 (BP Location: Right Arm)    Pulse 89   Temp 98.4 F (36.9 C) (Oral)   Resp 12   Ht  (1.549 m)   Wt 70.3 kg (155 lb)   LMP 11/27/2016 (Exact Date)   SpO2 100%   Breastfeeding? Unknown   BMI 29.29 kg/m   General: NAD CV: RRR Pulm: CTABL, nl effort ABD: s/nd/nt, fundus firm and below the umbilicus Lochia: moderate Incision: c/d/i DVT Evaluation: LE non-ttp, no evidence of DVT on exam.  Hemoglobin  Date Value Ref Range Status  03/12/2017 12.9 12.0 - 16.0 g/dL Final   HGB  Date Value Ref Range Status  04/09/2013 12.5 12.0 - 16.0 g/dL Final   HCT  Date Value Ref Range Status  03/12/2017 38.2 35.0 - 47.0 % Final  04/10/2013 32.6 (L) 35.0 - 47.0 % Final     Disposition: stable, discharge to home. Baby Feeding: formula Baby Disposition: home with mom  Rh Immune globulin given:  Rubella vaccine given: n/a Tdap vaccine given in AP or PP setting: PP Flu vaccine given in AP or PP setting: PP  Contraception: BTL  Prenatal Labs:   Blood type/Rh O neg  Antibody screen neg  Rubella Immune  Varicella Non-Immune  RPR NR  HBsAg Neg  HIV NR  GC neg  Chlamydia neg  Genetic screening negative  1 hour GTT Not done  3 hour GTT Not done  GBS Not done     Plan:  Gabriela Woodard was discharged to home in good condition. Follow-up appointment with delivering provider in 6 weeks.  Discharge Medications: PNV, Fe, Norco  Signed: Sharee Pimple, RN, MSN, CNM, FNP

## 2017-03-13 NOTE — Progress Notes (Signed)
Pharmacy notified needing Tylenol  in pxysis...machine reading insufficient quanity

## 2017-03-13 NOTE — Consult Note (Signed)
Neonatology Note:   Attendance at C-section:    I was asked by Dr. Elesa Massed to attend this repeat C/S at term. The mother is a G4P2A1 O neg (Ab neg), GBS not done with cigarette smoking, history of oligohydramnios, which resolved, history of previous C-section, late [facility] starting at 33 weeks, and varicella NI.  Mother was late to care and did not receive Rhogam.ROM at delivery, fluid clear. CAN X 2. Infant vigorous with good spontaneous cry and tone. Delayed cord clamping was done. Needed only minimal bulb suctioning. Ap 9/9. Lungs clear to ausc in DR. Infant is borderline SGA at [redacted] weeks GA. To CN to care of Pediatrician.  Would recommend monitoring blood glucose levels and would also obtain a Tc bilirubin at 2, 12, and 24 hours.   Doretha Sou, MD

## 2017-03-13 NOTE — Op Note (Signed)
Cesarean Section Procedure Note  03/13/2017  Patient:  Gabriela Woodard  31 y.o. female at [redacted]w[redacted]d.  Patient's last menstrual period was 11/27/2016 (exact date). Preoperative diagnosis:  Prior Cesarean   Sterilization Postoperative diagnosis:  Prior Cesarean   Sterilization, live born female  PROCEDURE:  Procedure(s): CESAREAN SECTION WITH BILATERAL TUBAL LIGATION (Bilateral) BILATERAL TUBAL LIGATION   Surgeon:  Surgeon(s) and Role:    * Ward, Elenora Fender, MD - Primary Anesthesia:  spinal I/O: Total I/O In: 1700 [I.V.:1700] Out: 850 [Urine:250; Blood:600] Specimens:  Cord Blood,  right tube, portion of left tube Complications: None Apparent Disposition:  VS stable to PACU  Findings: normal uterus, tubes and ovaries bilaterally Scarring of bladder to high anterior uterus Omentum scarred to left lateral and anterior uterus, and left fallopian tube and ovary. Live born female  Birth Weight: 5 lb 9.2 oz (2530 g) APGAR: 9, 9   Indication for procedure: 31 y.o. female at [redacted]w[redacted]d dated by 33wk ultrasound and limited prenatal care presents for scheduled repeat x4 cesarean with request for permanent sterilization  Procedure Details   The risks, benefits, complications, treatment options, and expected outcomes were discussed with the patient. Informed consent was obtained. The patient was taken to Operating Room, identified as Gabriela Woodard and the procedure verified as a cesarean delivery.   After administration of anesthesia, the patient was prepped and draped in the usual sterile manner, including a vaginal prep. A surgical time out was performed, with the pediatric team present. After confirming adequate anesthesia, a Pfannenstiel incision was made and carried down through the subcutaneous tissue to the fascia, which was thickened in scar tissue. Fascial incision was made and extended transversely. The fascia was separated from the underlying rectus tissue superiorly and inferiorly. The rectus  muscles were scarred together and bovie cauterization was used to separate the layers until the peritoneum was identified and entered. Peritoneal incision was extended longitudinally.  The bladder was tacked high on the uterus.  The vesicouterine peritoneum was carefully divided and the bladder was bluntly separated from the uterine serosa. A bladder blade was inserted.  A low transverse uterine incision was made. Delivered from cephalic presentation was a live born female. Delayed cord clamping was performed for >60 seconds, during which we sang happy birthday to baby Gabriela Woodard. The umbilical cord was doubly clamped and cut, and the baby was handed off to the awaitng pediatrician.  Cord blood was obtained for evaluation. The placenta was removed intact and appeared normal. The uterus was delivered from the abdominal cavity and cleared of clots, membranes, and debris. The uterine incision was closed with running locking sutures of 0 Vicryl, and then a second, imbricating stitch was placed. Hemostasis was observed.   The attention was turned to the bilateral fallopian tubes.  The left tube was partially obscured near the fimbria by the omentum.  Attempts to clear this area were made but ultimately unable to see the entire tube without disruption of surrounding tissue.  The tube was grasped at the middle of the isthmus.  The mesosalpinx was opened, and suture ligation was placed at the proximal and distal end of the tube.  The portion of tube between the suture was divided and handed to nursing as portion of left tube The right tube was removed by placing a kelly clamp in the mesosalpinx, a Haney stitch placed and tied down in a step-wise fashion. Both sites were hemostatic.  The abdominal cavity was evacuated of extraneous fluid. The uterus was  returned to the abdominal cavity and again the incision was inspected for hemostasis, which was confirmed.  The paracolic gutters were cleared, and the tubal ligation  sutures were observed to be intact. The fascia was then reapproximated with running suture of vicryl. 60cc of Long- and short-acting bupivicaine was injected circumferentially into the fascia.  After a change of gloves, the subcutaneous tissue was irrigated and reapproximated with 3-0 vicryl. The skin was closed with 4-0 Monocryl and 40cc of long- and short-acting bupivacaine injected into the skin and subcutaneous tissues.  The incision was covered with surgical glue.     Instrument, sponge, and needle counts were correct prior the abdominal closure and at the conclusion of the case.   I was present and performed this procedure in its entirety.  ----- Ranae Plumber, MD Attending Obstetrician and Gynecologist [facility], Department of OB/GYN [facility]

## 2017-03-13 NOTE — Anesthesia Post-op Follow-up Note (Signed)
Anesthesia QCDR form completed.        

## 2017-03-13 NOTE — Anesthesia Preprocedure Evaluation (Signed)
Anesthesia Evaluation  Patient identified by MRN, date of birth, ID band Patient awake    Reviewed: Allergy & Precautions, NPO status , Patient's Chart, lab work & pertinent test results, reviewed documented beta blocker date and time   Airway Mallampati: III  TM Distance: >3 FB     Dental  (+) Chipped   Pulmonary Current Smoker,           Cardiovascular      Neuro/Psych Seizures -,     GI/Hepatic   Endo/Other  diabetes  Renal/GU      Musculoskeletal   Abdominal   Peds  Hematology   Anesthesia Other Findings   Reproductive/Obstetrics                             Anesthesia Physical Anesthesia Plan  ASA: II  Anesthesia Plan: Spinal   Post-op Pain Management:    Induction:   PONV Risk Score and Plan:   Airway Management Planned:   Additional Equipment:   Intra-op Plan:   Post-operative Plan:   Informed Consent: I have reviewed the patients History and Physical, chart, labs and discussed the procedure including the risks, benefits and alternatives for the proposed anesthesia with the patient or authorized representative who has indicated his/her understanding and acceptance.     Plan Discussed with: CRNA  Anesthesia Plan Comments:         Anesthesia Quick Evaluation

## 2017-03-13 NOTE — Interval H&P Note (Signed)
History and Physical Interval Note:  03/13/2017 7:40 AM  Gabriela Woodard  has presented today for surgery, with the diagnosis of Prior Cesarean and desire for permanent sterilization  The various methods of treatment have been discussed with the patient and family. After consideration of risks, benefits and other options for treatment, the patient has consented to  Procedure(s): CESAREAN SECTION WITH BILATERAL TUBAL LIGATION (Bilateral) as a surgical intervention .  The patient's history has been reviewed, patient examined, no change in status, stable for surgery.  I have reviewed the patient's chart and labs.  Questions were answered to the patient's satisfaction.     Ellamarie Naeve C Ladiamond Gallina

## 2017-03-13 NOTE — Transfer of Care (Signed)
Immediate Anesthesia Transfer of Care Note  Patient: Gabriela Woodard  Procedure(s) Performed: Procedure(s): CESAREAN SECTION WITH BILATERAL TUBAL LIGATION (Bilateral)  Patient Location: PACU  Anesthesia Type:Spinal  Level of Consciousness: awake, alert , oriented and patient cooperative  Airway & Oxygen Therapy: Patient Spontanous Breathing  Post-op Assessment: Report given to RN and Post -op Vital signs reviewed and stable  Post vital signs: Reviewed and stable  Last Vitals:  Vitals:   03/13/17 0658 03/13/17 1018  BP: 117/78 (!) 141/97  Pulse: 89   Resp: 18 (!) 7  Temp: 36.9 C   SpO2:  100%    Last Pain:  Vitals:   03/13/17 0703  TempSrc:   PainSc: 0-No pain         Complications: No apparent anesthesia complications

## 2017-03-14 LAB — CBC
HCT: 28.1 % — ABNORMAL LOW (ref 35.0–47.0)
Hemoglobin: 9.7 g/dL — ABNORMAL LOW (ref 12.0–16.0)
MCH: 30.7 pg (ref 26.0–34.0)
MCHC: 34.3 g/dL (ref 32.0–36.0)
MCV: 89.4 fL (ref 80.0–100.0)
Platelets: 321 10*3/uL (ref 150–440)
RBC: 3.15 MIL/uL — AB (ref 3.80–5.20)
RDW: 15.2 % — ABNORMAL HIGH (ref 11.5–14.5)
WBC: 20.7 10*3/uL — ABNORMAL HIGH (ref 3.6–11.0)

## 2017-03-14 LAB — FETAL SCREEN: FETAL SCREEN: NEGATIVE

## 2017-03-14 MED ORDER — RHO D IMMUNE GLOBULIN 1500 UNIT/2ML IJ SOSY
300.0000 ug | PREFILLED_SYRINGE | Freq: Once | INTRAMUSCULAR | Status: AC
  Administered 2017-03-14: 300 ug via INTRAVENOUS
  Filled 2017-03-14: qty 2

## 2017-03-14 NOTE — Clinical Social Work Maternal (Signed)
  CLINICAL SOCIAL WORK MATERNAL/CHILD NOTE  Patient Details  Name: Gabriela Woodard MRN: [id number] Date of Birth: [birth year]  Date:  03/14/2017  Clinical Social Worker Initiating Note:  Santiago Bumpers, MSW, [place] DP Date/Time: Initiated:  03/14/17/1309     Child's Name:  Reece Agar   Biological Parents:  Mother, Father   Need for Interpreter:  None   Reason for Referral:   (Request for resource information)   Address:  [place] [address]    Phone number:  [phone] (home)     Additional phone number: None  Household Members/Support Persons (HM/SP):       HM/SP Name Relationship DOB or Age  HM/SP -1  Mr. Jansma Paternal Grandfather Unknown  HM/SP -2        HM/SP -3        HM/SP -4        HM/SP -5        HM/SP -6        HM/SP -7        HM/SP -8          Natural Supports (not living in the home):  [employer], Medical laboratory scientific officer, Extended Family, Friends, Immediate Family, Education administrator, Artist Supports:   None  Employment: Full-time   Type of Work: FOB works in Architect.   Education:  High school graduate   Homebound arranged:    Financial Resources:  Medicaid   Other Resources:  [facility]   Cultural/Religious Considerations Which May Impact Care:  None noted  Strengths:  Compliance with medical plan , Home prepared for child , Understanding of illness, Pediatrician chosen, Ability to meet basic needs    Psychotropic Medications:    None  Pediatrician:     [place] [place] [facility]  Pediatrician List:   [facility]      Pediatrician Fax Number: Unknown  Risk Factors/Current Problems:  Other (Comment) (Housing)   Cognitive State:  Linear Thinking , Alert , Goal Oriented    Mood/Affect:  Calm , Happy    CSW Assessment: CSW met with the parents of the baby and the paternal grandfather at bedside having been consulted  for car seat needs and questions about housing. According to the attending RN, the parents of the baby had been renting a home that became foreclosed and are now living with the paternal grandfather. The family was appropriate with the child; however, the paternal grandfather seems irritable due to social work presence as he has reported that he will assist with needs in the home, including the car seat. The FOB and MOB thanked the CSW, and the paternal grandfather indicated that there are no needs. CSW provided the FOB and MOB a resource list. CSW signing off.  CSW Plan/Description:  Information/Referral to [employer], [place] 03/14/2017, 1:11 PM

## 2017-03-14 NOTE — Progress Notes (Addendum)
Subjective: Postpartum Day#1   Cesarean Delivery POD#1 Patient reports    Objective: Vital signs in last 24 hours: Temp:  [97.7 F (36.5 C)-98.7 F (37.1 C)] 98.6 F (37 C) (09/22 0734) Pulse Rate:  [51-104] 78 (09/22 0734) Resp:  [0-24] 17 (09/22 0734) BP: (92-141)/(51-97) 93/57 (09/22 0734) SpO2:  [90 %-100 %] 97 % (09/22 0734)  Physical Exam:  General: alert, cooperative and appears stated age  Heart:S1S2, RRR, No M/R/G. Lungs:CTA bilat, no W/R/R Lochia: appropriate, no clots Uterine Fundus: firm, U-2 Incision: healing well DVT Evaluation: No evidence of DVT seen on physical exam. Neg Homans  Recent Labs  03/12/17 1107 03/14/17 0722  HGB 12.9 9.7*  HCT 38.2 28.1*    Assessment/Plan: Status post Cesarean section. Doing well postoperatively.  Continue current care.  Sharee Pimple 03/14/2017, 8:38 AM

## 2017-03-14 NOTE — Anesthesia Postprocedure Evaluation (Signed)
Anesthesia Post Note  Patient: Gabriela Woodard  Procedure(s) Performed: Procedure(s) (LRB): CESAREAN SECTION WITH BILATERAL TUBAL LIGATION (Bilateral)  Patient location during evaluation: PACU Anesthesia Type: Spinal Level of consciousness: oriented and awake and alert Pain management: pain level controlled Vital Signs Assessment: post-procedure vital signs reviewed and stable Respiratory status: spontaneous breathing, respiratory function stable and patient connected to nasal cannula oxygen Cardiovascular status: blood pressure returned to baseline and stable Postop Assessment: no headache, no backache and no apparent nausea or vomiting Anesthetic complications: no     Last Vitals:  Vitals:   03/14/17 0446 03/14/17 0734  BP: (!) 92/56 (!) 93/57  Pulse: 79 78  Resp: 20 17  Temp: 36.5 C 37 C  SpO2: 96% 97%    Last Pain:  Vitals:   03/14/17 0919  TempSrc:   PainSc: 8                  Noya Santarelli S

## 2017-03-15 LAB — RHOGAM INJECTION: Unit division: 0

## 2017-03-15 MED ORDER — GABAPENTIN 100 MG PO CAPS: 100.0000 mg | ORAL_CAPSULE | Freq: Two times a day (BID) | ORAL | Status: DC

## 2017-03-15 MED ORDER — GABAPENTIN 300 MG PO CAPS
300.0000 mg | ORAL_CAPSULE | Freq: Two times a day (BID) | ORAL | Status: DC
  Administered 2017-03-15 – 2017-03-16 (×2): 300 mg via ORAL
  Filled 2017-03-15 (×2): qty 1

## 2017-03-15 NOTE — Progress Notes (Signed)
Subjective: Postpartum Day 2: Cesarean Delivery Patient reports incisional pain.  Feels "she hurts more than any other baby along the incision site as it is burning". Pain is not controlled even though she has taken 10 mg Roxicodone every 4-5 hours, Ibuprofen 600 mg po every 6 hours and Tyelnol  every 6 hours  Objective: Vital signs in last 24 hours: Temp:  [97.4 F (36.3 C)-98.9 F (37.2 C)] 97.7 F (36.5 C) (09/23 1240) Pulse Rate:  [73-91] 73 (09/23 0755) Resp:  [20] 20 (09/23 0755) BP: (107-132)/(63-68) 107/63 (09/23 0755) SpO2:  [98 %-99 %] 98 % (09/23 0755)  Physical Exam:  General: alert, cooperative and appears stated age  Heart:S1S2, RRR, no  M/R/G Lungs:CTA bilat, no W/R/R Lochia: mod, no clots Uterine Fundus: firm, U-2 Incision: healing well, no significant drainage, no significant erythema DVT Evaluation: No evidence of DVT seen on physical exam.   Recent Labs  03/14/17 0722  HGB 9.7*  HCT 28.1*    Assessment/Plan: Status post Cesarean section. Doing well postoperatively.  Continue current care.  Sharee Pimple 03/15/2017, 2:38 PM

## 2017-03-15 NOTE — Progress Notes (Signed)
Patient ID: Gabriela Woodard, female   DOB: 03/20/1986, 31 y.o.   MRN: [id number]  Pt' s pain is being controlled by meds but, Dr Dalbert Garnet recommended Gabapentin  po BID for the burning sensation at the surgical site. Will start tonight. Report to RN to inform pt

## 2017-03-16 LAB — SURGICAL PATHOLOGY

## 2017-03-16 MED ORDER — IBUPROFEN 600 MG PO TABS: 600.0000 mg | ORAL_TABLET | Freq: Four times a day (QID) | ORAL | 0 refills | Status: DC

## 2017-03-16 MED ORDER — GABAPENTIN 300 MG PO CAPS: 300.0000 mg | ORAL_CAPSULE | Freq: Two times a day (BID) | ORAL | 0 refills | Status: DC

## 2017-03-16 MED ORDER — OXYCODONE HCL 5 MG PO TABS: ORAL_TABLET | ORAL | 0 refills | Status: DC

## 2017-03-16 MED ORDER — DOCUSATE SODIUM 100 MG PO CAPS: 100.0000 mg | ORAL_CAPSULE | Freq: Every day | ORAL | 2 refills | Status: DC | PRN

## 2017-03-16 NOTE — Progress Notes (Signed)
Subjective: Postpartum Day 3: Cesarean Delivery Patient reports incisional pain.   improving Objective: Vital signs in last 24 hours: Temp:  [97.7 F (36.5 C)-98.7 F (37.1 C)] 98.6 F (37 C) (09/24 0738) Pulse Rate:  [66-98] 66 (09/24 0738) Resp:  [17-20] 17 (09/24 0738) BP: (128-135)/(73-84) 135/73 (09/24 0738) SpO2:  [99 %] 99 % (09/24 0738)  Physical Exam:  General: alert and cooperative Lochia: appropriate Uterine Fundus: firm Incision: healing well DVT Evaluation: No evidence of DVT seen on physical exam.   Recent Labs  03/14/17 0722  HGB 9.7*  HCT 28.1*    Assessment/Plan: Status post Cesarean section. Doing well postoperatively.  Continue current care. D/c home  Gabriela Woodard 03/16/2017, 10:34 AM

## 2017-03-16 NOTE — Discharge Instructions (Signed)
Please call your doctor or return to the ER if you experience any chest pains, shortness of breath, dizziness, visual changes, fever greater than 101, any heavy bleeding (saturating more than 1 pad per hour), large clots, or foul smelling discharge, any worsening abdominal pain and cramping that is not controlled by pain medication, or any signs of postpartum depression. No tampons, enemas, douches, or sexual intercourse for 6 weeks. Also avoid tub baths, hot tubs, or swimming for 6 weeks.    Check your incision daily for any signs of infection such as redness, warmth, swelling, increased pain, or pus/foul smelling discharge   Activity: do not lift over 10 lbs for 6 weeks  No driving for 1-2 weeks or while taking narcotics  Pelvic rest for 6 weeks

## 2017-03-16 NOTE — Progress Notes (Signed)
Discharge order received from doctor. Tdap, varicella, and flu vaccine offered prior to discharge. Patient declined Tdap, varicella and flu vaccines. Reviewed discharge instructions and prescriptions with patient and answered all questions. Incision cleaning kit given. Follow up appointment given. Patient verbalized understanding. ID bands checked. Patient discharged home with infant via wheelchair by nursing/auxillary.    Hilbert Bible, RN

## 2017-03-16 NOTE — Discharge Summary (Signed)
  Pt stayed extra day due to incisional pain , better on 03/16/17

## 2017-03-26 ENCOUNTER — Other Ambulatory Visit: Payer: Self-pay | Admitting: Obstetrics & Gynecology

## 2017-03-26 MED ORDER — DOXYCYCLINE HYCLATE 100 MG IV SOLR: 100.0000 mg | Freq: Two times a day (BID) | INTRAVENOUS | Status: DC

## 2017-03-27 ENCOUNTER — Encounter: Payer: Self-pay | Admitting: Certified Registered Nurse Anesthetist

## 2017-03-27 SURGERY — DILATION AND CURETTAGE
Anesthesia: Choice

## 2017-03-27 NOTE — H&P (Signed)
Chief Complaint: post operative follow up after Cesarean section  History of Present Illness: Gabriela Woodard is a 31 y.o. [id number] who returns today for a postoperative check.   She underwent a repeat Cesarean section 2 weeks ago.  She reports she is doing well with some problems : she has intense cramping almost all of the time, and persistent bleeding.  She She rates her pain as a 7 on a scale of 0-10.Marland Kitchen  Past Medical History: has no past medical history on file. Problem List: has High-risk pregnancy in third trimester; Insufficient prenatal care in third trimester; and Vaginal bleeding, unspecified on her problem list. Past Surgical History: has a past surgical history that includes Cesarean section. Family History: family history includes Breast cancer in her maternal grandmother. Social History: reports that she has been smoking Cigarettes. She has been smoking about 0.50 packs per day. She has never used smokeless tobacco. She reports that she does not drink alcohol or use drugs. Current Medications: has a current medication list which includes the following prescription(s): acetaminophen, acetaminophen/pamabrom, ibuprofen, and prenatal vit-iron fum-folic ac. Allergies: has No Known Allergies.  Medications:  Current Outpatient Prescriptions on File Prior to Visit  Medication Sig Dispense Refill  . prenatal vit-iron fum-folic ac (PRENAVITE) tablet Take 1 tablet by mouth once daily.   No current facility-administered medications on file prior to visit.   Allergies: Patient has no known allergies.  Physical Exam: BP 126/76  Pulse (!) 120  Ht 154.9 cm (5' 0.98")  Wt 70 kg (154 lb 6.4 oz)  LMP (LMP Unknown)  Breastfeeding? No  BMI 29.19 kg/m   General appearance: Well nourished, well developed female in no acute distress.   CV: RRR  Pulm: normal effort, CTABL  Abdomen: Nontender to palpation, nondistended, no masses or hernias.  Incisions: healing well, no significant drainage,  no dehiscence, no significant erythema.  Neuro/Psych: Normal mood and affect.   Pelvic exam: deferred  Ultrasound performed, and images reviewed by me. Shows normal adnexa bilaterally Uterus is enlarged, appropriately for postpartum. Thickened endometrium to 1.8cm, with either clot or retained products.   Assessment: Routine postoperative visit after Cesarean. Possible retained products on ultrasound  Plan: To OR tomorrow for [facility], unsure if retained products or blood clot. Consents signed.  Postop care: Gradual return to normal activity and diet. Restrictions discussed. Questions answered. Pt aware when to call for concerns, issues.   Contraception: BTL   ----- Ranae Plumber, MD Attending Obstetrician and Gynecologist [facility], Department of OB/GYN [facility]

## 2017-03-30 ENCOUNTER — Ambulatory Visit
Admission: RE | Admit: 2017-03-30 | Payer: Medicaid Other | Source: Ambulatory Visit | Admitting: Obstetrics & Gynecology

## 2017-06-23 ENCOUNTER — Other Ambulatory Visit: Payer: Self-pay

## 2017-06-23 ENCOUNTER — Encounter: Payer: Self-pay | Admitting: Emergency Medicine

## 2017-06-23 ENCOUNTER — Emergency Department
Admission: EM | Admit: 2017-06-23 | Discharge: 2017-06-23 | Disposition: A | Discharge status: Home / Self Care | Payer: Medicaid Other | Attending: Emergency Medicine | Admitting: Emergency Medicine

## 2017-06-23 DIAGNOSIS — K0889 Other specified disorders of teeth and supporting structures: Secondary | ICD-10-CM | POA: Diagnosis present

## 2017-06-23 DIAGNOSIS — F17210 Nicotine dependence, cigarettes, uncomplicated: Secondary | ICD-10-CM | POA: Diagnosis not present

## 2017-06-23 DIAGNOSIS — K047 Periapical abscess without sinus: Secondary | ICD-10-CM | POA: Insufficient documentation

## 2017-06-23 DIAGNOSIS — K029 Dental caries, unspecified: Secondary | ICD-10-CM | POA: Diagnosis not present

## 2017-06-23 MED ORDER — PENICILLIN V POTASSIUM 500 MG PO TABS: 500.0000 mg | ORAL_TABLET | Freq: Four times a day (QID) | ORAL | 0 refills | Status: DC

## 2017-06-23 MED ORDER — TRAMADOL HCL 50 MG PO TABS: 50.0000 mg | ORAL_TABLET | Freq: Four times a day (QID) | ORAL | 0 refills | Status: DC | PRN

## 2017-06-23 NOTE — ED Notes (Signed)
Se triage note  Presents with possible dental abscess /pain to right upper a gumline

## 2017-06-23 NOTE — ED Triage Notes (Signed)
Pt reports right upper dental pain that began yesterday. No apparent distress noted.

## 2017-06-23 NOTE — Discharge Instructions (Signed)
Begin taking antibiotics as directed until completely finished. Tramadol as needed for pain. You will need to see a dentist. Call one of the dentist listed on your dental papers.  OPTIONS FOR DENTAL FOLLOW UP CARE  [place] Department of Health and Human Services - Local Safety Net Dental Clinics [email or web address]://www.ncdhhs.gov/dph/oralhealth/services/safetynetclinics.htm   [facility] [phone])  [facility] [phone])  [facility] [phone])  [facility] [phone])  [facility] [phone]) This clinic caters to the indigent population and is on a lottery system. Location: [employer] [address], [address], [place] [facility] Hours: Wednesdays from 6pm - 9pm, patients seen by a lottery system. For dates, call or go to [email or web address] Services: Cleanings, fillings and simple extractions. Payment Options: DENTAL WORK IS FREE OF CHARGE. Bring proof of income or support. Best way to get seen: Arrive at 5:15 pm - this is a lottery, NOT first come/first serve, so arriving earlier will not increase your chances of being seen.     [facility] [phone] Select option 1 for emergencies   Location: [facility], [address], [place] [facility] Hours: No walk-ins accepted - call the day before to schedule an appointment. Check in times are 9:30 am and 1:30 pm. Services: Simple extractions, temporary fillings, pulpectomy/pulp debridement, uncomplicated abscess drainage. Payment Options: PAYMENT IS DUE AT THE TIME OF SERVICE.  Fee is usually $100-200, additional surgical procedures (e.g. abscess drainage) may be extra. Cash, checks, Visa/MasterCard accepted.  Can file Medicaid if patient is covered for dental - patient should call case worker to check. No discount for [facility] patients. Best way to get  seen: MUST call the day before and get onto the schedule. Can usually be seen the next 1-2 days. No walk-ins accepted.     [facility] [phone]   Location: [facility], [address], [place] [facility] Hours: M, W, Th, F 8am or 1:30pm, Tues 9a or 1:30 - first come/first served. Services: Simple extractions, temporary fillings, uncomplicated abscess drainage.  You do not need to be an [place] resident. Payment Options: PAYMENT IS DUE AT THE TIME OF SERVICE. Dental insurance, otherwise sliding scale - bring proof of income or support. Depending on income and treatment needed, cost is usually $50-200. Best way to get seen: Arrive early as it is first come/first served.     [facility] [phone]   Location: [address] Clinic Hours: Mon-Thu 8a-5p Services: Most basic dental services including extractions and fillings. Payment Options: PAYMENT IS DUE AT THE TIME OF SERVICE. Sliding scale, up to 50% off - bring proof if income or support. Medicaid with dental option accepted. Best way to get seen: Call to schedule an appointment, can usually be seen within 2 weeks OR they will try to see walk-ins - show up at 8a or 2p (you may have to wait).     [facility] [phone] [place] RESIDENTS ONLY   Location: [facility], [address], [place], [place] [address] Clinic Hours: By appointment only. Monday - Thursday 8am-5pm, Friday 8am-12pm Services: Cleanings, fillings, extractions. Payment Options: PAYMENT IS DUE AT THE TIME OF SERVICE. Cash, Visa or MasterCard. Sliding scale - $30 minimum per service. Best way to get seen: Come in to office, complete packet and make an appointment - need proof of income or support monies for each household  member and proof of [facility] residence. Usually takes about a month to get in.     [facility] [phone]   Location: [address], [place] Clinic Hours: Walk-in Urgent Care Dental Services are offered Monday-Friday mornings only. The numbers of emergencies accepted daily is limited to the number of providers available. Maximum 15 - Mondays, Wednesdays & Thursdays Maximum 10 - Tuesdays & Fridays Services: You do not need to be a [facility] resident to be seen for a dental emergency. Emergencies are defined as pain, swelling, abnormal bleeding, or dental trauma. Walkins will receive x-rays if needed. NOTE: Dental cleaning is not an emergency. Payment Options: PAYMENT IS DUE AT THE TIME OF SERVICE. Minimum co-pay is $40.00 for uninsured patients. Minimum co-pay is $3.00 for Medicaid with dental coverage. Dental Insurance is accepted and must be presented at time of visit. Medicare does not cover dental. Forms of payment: Cash, credit card, checks. Best way to get seen: If not previously registered with the clinic, walk-in dental registration begins at 7:15 am and is on a first come/first serve basis. If previously registered with the clinic, call to make an appointment.     [facility] [place] ONLY   Location: [address], [place], [place] Clinic Hours: Mon-Thu 10a-2p Services: Extractions only! Payment Options: FREE (donations accepted) - bring proof of income or support Best way to get seen: Call and schedule an appointment OR come at 8am on the 1st Monday of every month (except for holidays) when it is first come/first served.     [facility] [phone]   Location: [place], [place] Clinic Hours: Friday mornings Services, Payment Options, Best way to get seen: Call for info

## 2017-06-23 NOTE — ED Provider Notes (Signed)
[facility] Emergency Department Provider Note   ____________________________________________   First MD Initiated Contact with Patient 06/23/17 1659     (approximate)  I have reviewed the triage vital signs and the nursing notes.   HISTORY  Chief Complaint Dental Pain   HPI Gabriela Woodard is a 32 y.o. female his complaint of dental pain.patient states that pain began to get worse yesterday and this morning when she woke up her face was swollen. She is not taking any over-the-counter medication for this. She does not have a dentist. She rates her pain as 10 over 10.   Past Medical History:  Diagnosis Date  . Seizures (HCC)    1 seizure at age 43    Patient Active Problem List   Diagnosis Date Noted  . Delivery by elective cesarean section 03/13/2017  . Smoking (tobacco) complicating childbirth 03/13/2017  . Insufficient prenatal care 03/13/2017  . Supervision of high risk pregnancy in third trimester 03/13/2017  . H/O gestational diabetes in prior pregnancy, currently pregnant 11/29/2012  . Rh negative state in antepartum period 10/26/2012  . Previous cesarean section 10/25/2012    Past Surgical History:  Procedure Laterality Date  . CESAREAN SECTION    . CESAREAN SECTION WITH BILATERAL TUBAL LIGATION Bilateral 03/13/2017   Procedure: CESAREAN SECTION WITH BILATERAL TUBAL LIGATION;  Surgeon: Ward, Elenora Fender, MD;  Location: [facility] ORS;  Service: Obstetrics;  Laterality: Bilateral;    Prior to Admission medications   Medication Sig Start Date End Date Taking? Authorizing Provider  penicillin v potassium (VEETID) 500 MG tablet Take 1 tablet (500 mg total) by mouth 4 (four) times daily. 06/23/17   Tommi Rumps, PA-C  traMADol (ULTRAM) 50 MG tablet Take 1 tablet (50 mg total) by mouth every 6 (six) hours as needed. 06/23/17   Tommi Rumps, PA-C    Allergies Patient has no known allergies.  Family History  Problem Relation Age of Onset  .  Cancer Maternal Grandmother        breast  . Cancer Other        breast; maternal grandma's sister.  . Diabetes Mellitus II Paternal Grandmother     Social History Social History   Tobacco Use  . Smoking status: Current Every Day Smoker    Packs/day: 1.00    Years: 15.00    Pack years: 15.00    Types: Cigarettes  . Smokeless tobacco: Never Used  Substance Use Topics  . Alcohol use: No  . Drug use: No    Review of Systems Constitutional: No fever/chills Eyes: No visual changes. ENT: positive dental pain. Cardiovascular: Denies chest pain. Respiratory: Denies shortness of breath. Gastrointestinal:  No nausea, no vomiting.  Neurological: Negative for headaches ___________________________________________   PHYSICAL EXAM:  VITAL SIGNS: ED Triage Vitals  Enc Vitals Group     BP 06/23/17 1635 136/86     Pulse Rate 06/23/17 1635 83     Resp 06/23/17 1635 18     Temp 06/23/17 1635 98 F (36.7 C)     Temp Source 06/23/17 1635 Oral     SpO2 06/23/17 1635 99 %     Weight 06/23/17 1635 155 lb (70.3 kg)     Height 06/23/17 1635 5\' 1"  (1.549 m)     Head Circumference --      Peak Flow --      Pain Score 06/23/17 1634 10     Pain Loc --      Pain Edu? --  Excl. in GC? --     Constitutional: Alert and oriented. Well appearing and in no acute distress. Eyes: Conjunctivae are normal.  Head: Atraumatic. Nose: No congestion/rhinnorhea. Mouth/Throat: Mucous membranes are moist.  Oropharynx non-erythematous. Right upper molars are in poor dental repair. Gums are edematous. No obvious abscess was seen and no active drainage. Neck: No stridor.   Hematological/Lymphatic/Immunilogical: No cervical lymphadenopathy. Cardiovascular: Normal rate, regular rhythm. Grossly normal heart sounds.  Good peripheral circulation. Respiratory: Normal respiratory effort.  No retractions. Lungs CTAB. Musculoskeletal: moves upper and lower extremity swelling difficulty. Normal gait was  noted. Neurologic:  Normal speech and language. No gross focal neurologic deficits are appreciated. No gait instability. Skin:  Skin is warm, dry and intact. No rash noted. Psychiatric: Mood and affect are normal. Speech and behavior are normal.  ____________________________________________   LABS (all labs ordered are listed, but only abnormal results are displayed)  Labs Reviewed - No data to display  PROCEDURES  Procedure(s) performed: None  Procedures  Critical Care performed: No  ____________________________________________   INITIAL IMPRESSION / ASSESSMENT AND PLAN / ED COURSE Patient was given a prescription for Pen-Vee K 500 mg 4 times a day along with prescription for tramadol 1 every 6 hours as needed for pain. Patient was given a list of dental clinics in the area. She is aware that she will need to call to make arrangements to be seen or [facility].  ____________________________________________   FINAL CLINICAL IMPRESSION(S) / ED DIAGNOSES  Final diagnoses:  Dental abscess  Pain due to dental caries     ED Discharge Orders        Ordered    penicillin v potassium (VEETID) 500 MG tablet  4 times daily     06/23/17 1813    traMADol (ULTRAM) 50 MG tablet  Every 6 hours PRN     06/23/17 1813       Note:  This document was prepared using Dragon voice recognition software and may include unintentional dictation errors.    Tommi RumpsSummers, Marrian Bells L, PA-C 06/23/17 Gustavo [id number]    [place], [place], MD 06/23/17 2040

## 2018-12-16 ENCOUNTER — Other Ambulatory Visit: Payer: Self-pay

## 2018-12-16 ENCOUNTER — Emergency Department: Payer: Medicaid Other

## 2018-12-16 ENCOUNTER — Emergency Department
Admission: EM | Admit: 2018-12-16 | Discharge: 2018-12-17 | Disposition: A | Discharge status: Home / Self Care | Payer: Medicaid Other | Attending: Emergency Medicine | Admitting: Emergency Medicine

## 2018-12-16 DIAGNOSIS — T424X1A Poisoning by benzodiazepines, accidental (unintentional), initial encounter: Secondary | ICD-10-CM | POA: Diagnosis not present

## 2018-12-16 DIAGNOSIS — F17210 Nicotine dependence, cigarettes, uncomplicated: Secondary | ICD-10-CM | POA: Diagnosis not present

## 2018-12-16 DIAGNOSIS — R404 Transient alteration of awareness: Secondary | ICD-10-CM | POA: Diagnosis not present

## 2018-12-16 DIAGNOSIS — R4182 Altered mental status, unspecified: Secondary | ICD-10-CM | POA: Diagnosis present

## 2018-12-16 LAB — CBC WITH DIFFERENTIAL/PLATELET
Abs Immature Granulocytes: 0.03 10*3/uL (ref 0.00–0.07)
Basophils Absolute: 0.1 10*3/uL (ref 0.0–0.1)
Basophils Relative: 1 %
Eosinophils Absolute: 0.3 10*3/uL (ref 0.0–0.5)
Eosinophils Relative: 2 %
HCT: 40.8 % (ref 36.0–46.0)
Hemoglobin: 13.5 g/dL (ref 12.0–15.0)
Immature Granulocytes: 0 %
Lymphocytes Relative: 36 %
Lymphs Abs: 4 10*3/uL (ref 0.7–4.0)
MCH: 29.7 pg (ref 26.0–34.0)
MCHC: 33.1 g/dL (ref 30.0–36.0)
MCV: 89.9 fL (ref 80.0–100.0)
Monocytes Absolute: 0.6 10*3/uL (ref 0.1–1.0)
Monocytes Relative: 5 %
Neutro Abs: 6.3 10*3/uL (ref 1.7–7.7)
Neutrophils Relative %: 56 %
Platelets: 310 10*3/uL (ref 150–400)
RBC: 4.54 MIL/uL (ref 3.87–5.11)
RDW: 13.2 % (ref 11.5–15.5)
WBC: 11.3 10*3/uL — ABNORMAL HIGH (ref 4.0–10.5)
nRBC: 0 % (ref 0.0–0.2)

## 2018-12-16 LAB — SALICYLATE LEVEL: Salicylate Lvl: <7 mg/dL (ref 2.8–30.0)

## 2018-12-16 LAB — COMPREHENSIVE METABOLIC PANEL
ALT: 20 U/L (ref 0–44)
AST: 13 U/L — ABNORMAL LOW (ref 15–41)
Albumin: 4 g/dL (ref 3.5–5.0)
Alkaline Phosphatase: 60 U/L (ref 38–126)
Anion gap: 8 (ref 5–15)
BUN: 7 mg/dL (ref 6–20)
CO2: 25 mmol/L (ref 22–32)
Calcium: 8.4 mg/dL — ABNORMAL LOW (ref 8.9–10.3)
Chloride: 109 mmol/L (ref 98–111)
Creatinine, Ser: 0.99 mg/dL (ref 0.44–1.00)
GFR calc Af Amer: >60 mL/min (ref >60)
GFR calc non Af Amer: >60 mL/min (ref >60)
Glucose, Bld: 105 mg/dL — ABNORMAL HIGH (ref 70–99)
Potassium: 3.4 mmol/L — ABNORMAL LOW (ref 3.5–5.1)
Sodium: 142 mmol/L (ref 135–145)
Total Bilirubin: 0.5 mg/dL (ref 0.3–1.2)
Total Protein: 6.8 g/dL (ref 6.5–8.1)

## 2018-12-16 LAB — URINE DRUG SCREEN, QUALITATIVE (ARMC ONLY)
Amphetamines, Ur Screen: NOT DETECTED
Barbiturates, Ur Screen: NOT DETECTED
Benzodiazepine, Ur Scrn: POSITIVE — AB
Cannabinoid 50 Ng, Ur ~~LOC~~: NOT DETECTED
Cocaine Metabolite,Ur ~~LOC~~: NOT DETECTED
MDMA (Ecstasy)Ur Screen: NOT DETECTED
Methadone Scn, Ur: NOT DETECTED
Opiate, Ur Screen: NOT DETECTED
Phencyclidine (PCP) Ur S: NOT DETECTED
Tricyclic, Ur Screen: NOT DETECTED

## 2018-12-16 LAB — URINALYSIS, COMPLETE (UACMP) WITH MICROSCOPIC
Bilirubin Urine: NEGATIVE
Glucose, UA: NEGATIVE mg/dL
Hgb urine dipstick: NEGATIVE
Ketones, ur: NEGATIVE mg/dL
Nitrite: POSITIVE — AB
Protein, ur: NEGATIVE mg/dL
Specific Gravity, Urine: 1.012 (ref 1.005–1.030)
pH: 6 (ref 5.0–8.0)

## 2018-12-16 LAB — ACETAMINOPHEN LEVEL: Acetaminophen (Tylenol), Serum: <10 ug/mL — ABNORMAL LOW (ref 10–30)

## 2018-12-16 LAB — POCT PREGNANCY, URINE: Preg Test, Ur: NEGATIVE

## 2018-12-16 MED ORDER — SODIUM CHLORIDE 0.9 % IV BOLUS
500.0000 mL | Freq: Once | INTRAVENOUS | Status: AC
  Administered 2018-12-16: 500 mL via INTRAVENOUS

## 2018-12-16 MED ORDER — NALOXONE HCL 2 MG/2ML IJ SOSY
PREFILLED_SYRINGE | INTRAMUSCULAR | Status: AC
  Filled 2018-12-16: qty 2

## 2018-12-16 MED ORDER — NALOXONE HCL 2 MG/2ML IJ SOSY
1.0000 mg | PREFILLED_SYRINGE | Freq: Once | INTRAMUSCULAR | Status: AC
  Administered 2018-12-16: 19:00:00 1 mg via INTRAVENOUS
  Filled 2018-12-16: qty 2

## 2018-12-16 MED ORDER — NALOXONE HCL 2 MG/2ML IJ SOSY
2.0000 mg | PREFILLED_SYRINGE | Freq: Once | INTRAMUSCULAR | Status: AC
  Administered 2018-12-16: 2 mg via INTRAVENOUS

## 2018-12-16 NOTE — ED Notes (Signed)
Pt talking to MD and reporting her arms and legs are hurting. MD asked if pt has been taking drugs but pt stated, "I just went to get my kids and my mom was bitching because I don't see my daughter." Pt also reporting she takes Xanax and Suboxone but does not confirm if she took more than normal.

## 2018-12-16 NOTE — ED Notes (Signed)
Patient is restless in bed. Patient is arouses to name.
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
## 2018-12-16 NOTE — ED Notes (Signed)
Patient transported to CT 
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
## 2018-12-16 NOTE — ED Notes (Signed)
TTS has spoken with pts nurse who states that the pt is difficult to arouse at this time. She has agreed to contact TTS with the pt is alert and oriented to conduct tele pscyh evaluation.

## 2018-12-16 NOTE — ED Notes (Signed)
Unable to keep heart leads on patient due to movement.

## 2018-12-16 NOTE — ED Notes (Signed)
Patient states she took Xanax, when asked how much patient states maybe to much.

## 2018-12-16 NOTE — ED Notes (Signed)
Patient becomes alert with sternal rub by EDP.

## 2018-12-17 NOTE — ED Notes (Signed)
Patient continues to be resting with eyes closed. Patient arousals when calling her name.

## 2018-12-17 NOTE — ED Notes (Signed)
Resumed care from Maudie Mercury, [place]. Pt resting quietly while awaiting ride. Pt breathing deeply and evenly, covered with blanket. No needs expressed at this time.
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
## 2018-12-17 NOTE — ED Notes (Signed)
Patient alert and talking with this Probation officer. Patient asking to use phone.

## 2018-12-17 NOTE — ED Notes (Signed)
TTS received a call from the pts nurse. TTS consulted with the pts nurse to evaluate pts appropriateness for a assessment. She shares that the pt is not fully alert and oriented. Pt to be assessed in the AM.

## 2018-12-17 NOTE — ED Notes (Signed)
Patient continues to appear asleep. O2 WDL. Patient arouses when calling her name. Will continue to monitor.

## 2018-12-17 NOTE — ED Notes (Signed)
Pt discharged home after verbalizing understanding of discharge instructions; nad noted. 

## 2018-12-17 NOTE — Discharge Instructions (Signed)
He was seen tonight for an apparent overdose of benzodiazepines.  We understand that this was not intentional.  Please follow-up with [facility] if you have any concerns about substance abuse or mental health issues and try to avoid taking more medication than is indicated on the bottle and do not take anyone else's medication.  Call 911 or return to the emergency department if you have any thoughts about hurting yourself or anyone else.

## 2019-03-21 ENCOUNTER — Emergency Department
Admission: EM | Admit: 2019-03-21 | Discharge: 2019-03-21 | Disposition: A | Discharge status: Home / Self Care | Payer: Medicaid Other | Attending: Student in an Organized Health Care Education/Training Program | Admitting: Student in an Organized Health Care Education/Training Program

## 2019-03-21 ENCOUNTER — Other Ambulatory Visit: Payer: Self-pay

## 2019-03-21 ENCOUNTER — Encounter: Payer: Self-pay | Admitting: Emergency Medicine

## 2019-03-21 DIAGNOSIS — R220 Localized swelling, mass and lump, head: Secondary | ICD-10-CM | POA: Diagnosis present

## 2019-03-21 DIAGNOSIS — F17210 Nicotine dependence, cigarettes, uncomplicated: Secondary | ICD-10-CM | POA: Insufficient documentation

## 2019-03-21 DIAGNOSIS — K047 Periapical abscess without sinus: Secondary | ICD-10-CM | POA: Diagnosis not present

## 2019-03-21 LAB — CBC WITH DIFFERENTIAL/PLATELET
Abs Immature Granulocytes: 0.04 10*3/uL (ref 0.00–0.07)
Basophils Absolute: 0.1 10*3/uL (ref 0.0–0.1)
Basophils Relative: 1 %
Eosinophils Absolute: 0.6 10*3/uL — ABNORMAL HIGH (ref 0.0–0.5)
Eosinophils Relative: 4 %
HCT: 40.7 % (ref 36.0–46.0)
Hemoglobin: 13.6 g/dL (ref 12.0–15.0)
Immature Granulocytes: 0 %
Lymphocytes Relative: 16 %
Lymphs Abs: 2.3 10*3/uL (ref 0.7–4.0)
MCH: 29.1 pg (ref 26.0–34.0)
MCHC: 33.4 g/dL (ref 30.0–36.0)
MCV: 87.2 fL (ref 80.0–100.0)
Monocytes Absolute: 0.7 10*3/uL (ref 0.1–1.0)
Monocytes Relative: 5 %
Neutro Abs: 10.7 10*3/uL — ABNORMAL HIGH (ref 1.7–7.7)
Neutrophils Relative %: 74 %
Platelets: 318 10*3/uL (ref 150–400)
RBC: 4.67 MIL/uL (ref 3.87–5.11)
RDW: 13.9 % (ref 11.5–15.5)
WBC: 14.4 10*3/uL — ABNORMAL HIGH (ref 4.0–10.5)
nRBC: 0 % (ref 0.0–0.2)

## 2019-03-21 LAB — BASIC METABOLIC PANEL
Anion gap: 8 (ref 5–15)
BUN: <5 mg/dL — ABNORMAL LOW (ref 6–20)
CO2: 27 mmol/L (ref 22–32)
Calcium: 8.7 mg/dL — ABNORMAL LOW (ref 8.9–10.3)
Chloride: 105 mmol/L (ref 98–111)
Creatinine, Ser: 0.8 mg/dL (ref 0.44–1.00)
GFR calc Af Amer: >60 mL/min (ref >60)
GFR calc non Af Amer: >60 mL/min (ref >60)
Glucose, Bld: 127 mg/dL — ABNORMAL HIGH (ref 70–99)
Potassium: 3.2 mmol/L — ABNORMAL LOW (ref 3.5–5.1)
Sodium: 140 mmol/L (ref 135–145)

## 2019-03-21 MED ORDER — IBUPROFEN 600 MG PO TABS: 600.0000 mg | ORAL_TABLET | Freq: Three times a day (TID) | ORAL | 0 refills | Status: DC | PRN

## 2019-03-21 MED ORDER — TRAMADOL HCL 50 MG PO TABS: 50.0000 mg | ORAL_TABLET | Freq: Four times a day (QID) | ORAL | 0 refills | Status: AC | PRN

## 2019-03-21 MED ORDER — CLINDAMYCIN HCL 300 MG PO CAPS: 300.0000 mg | ORAL_CAPSULE | Freq: Three times a day (TID) | ORAL | 0 refills | Status: AC

## 2019-03-21 MED ORDER — KETOROLAC TROMETHAMINE 30 MG/ML IJ SOLN
30.0000 mg | Freq: Once | INTRAMUSCULAR | Status: AC
Start: 2019-03-21 — End: 2019-03-21
  Administered 2019-03-21: 30 mg via INTRAVENOUS
  Filled 2019-03-21: qty 1

## 2019-03-21 MED ORDER — HYDROMORPHONE HCL 1 MG/ML IJ SOLN
1.0000 mg | Freq: Once | INTRAMUSCULAR | Status: AC
  Administered 2019-03-21: 1 mg via INTRAMUSCULAR
  Filled 2019-03-21: qty 1

## 2019-03-21 MED ORDER — CLINDAMYCIN PHOSPHATE 600 MG/50ML IV SOLN
600.0000 mg | Freq: Once | INTRAVENOUS | Status: AC
  Administered 2019-03-21: 600 mg via INTRAVENOUS
  Filled 2019-03-21: qty 50

## 2019-03-21 NOTE — ED Triage Notes (Signed)
Presents with swelling to right side of face  States she has a couple of broken teeth on same side  Min swelling yesterday with increased pain

## 2019-03-21 NOTE — Discharge Instructions (Addendum)
Follow discharge care instructions and contact treating dentist.  Advised them you have started the antibiotics.

## 2019-03-21 NOTE — ED Provider Notes (Signed)
[facility] Emergency Department Provider Note   ____________________________________________   First MD Initiated Contact with Patient 03/21/19 1054     (approximate)  I have reviewed the triage vital signs and the nursing notes.   HISTORY  Chief Complaint Dental Pain    HPI Gabriela Woodard is a 33 y.o. female patient presents with right facial swelling.  Patient was seen by dentist 4 days ago secondary to multiple fractured teeth on the right side.  Patient said there was minimal swelling at that time.  Patient states dental state she would need to be on antibiotics before he could do any dental work.  Patient states swollen increase from last night.  Patient denies fever with this complaint.  Patient rates the pain as a 10/10.  Patient described pain is "achy".  No palliative measure for complaint.         Past Medical History:  Diagnosis Date  . Seizures ([place])    1 seizure at age 22    Patient Active Problem List   Diagnosis Date Noted  . Delivery by elective cesarean section 03/13/2017  . Smoking (tobacco) complicating childbirth 98/04/9146  . Insufficient prenatal care 03/13/2017  . Supervision of high risk pregnancy in third trimester 03/13/2017  . H/O gestational diabetes in prior pregnancy, currently pregnant 11/29/2012  . Rh negative state in antepartum period 10/26/2012  . Previous cesarean section 10/25/2012    Past Surgical History:  Procedure Laterality Date  . CESAREAN SECTION    . CESAREAN SECTION WITH BILATERAL TUBAL LIGATION Bilateral 03/13/2017   Procedure: CESAREAN SECTION WITH BILATERAL TUBAL LIGATION;  Surgeon: Ward, Honor Loh, MD;  Location: [facility] ORS;  Service: Obstetrics;  Laterality: Bilateral;    Prior to Admission medications   Medication Sig Start Date End Date Taking? Authorizing Provider  clindamycin (CLEOCIN) 300 MG capsule Take 1 capsule (300 mg total) by mouth 3 (three) times daily for 10 days. 03/21/19 03/31/19   Sable Feil, PA-C  ibuprofen (ADVIL) 600 MG tablet Take 1 tablet (600 mg total) by mouth every 8 (eight) hours as needed. 03/21/19   Sable Feil, PA-C  traMADol (ULTRAM) 50 MG tablet Take 1 tablet (50 mg total) by mouth every 6 (six) hours as needed. 03/21/19 03/20/20  Sable Feil, PA-C    Allergies Patient has no known allergies.  Family History  Problem Relation Age of Onset  . Cancer Maternal Grandmother        breast  . Cancer Other        breast; maternal grandma's sister.  . Diabetes Mellitus II Paternal Grandmother     Social History Social History   Tobacco Use  . Smoking status: Current Every Day Smoker    Packs/day: 1.00    Years: 15.00    Pack years: 15.00    Types: Cigarettes  . Smokeless tobacco: Never Used  Substance Use Topics  . Alcohol use: No  . Drug use: No    Review of Systems  Constitutional: No fever/chills Eyes: No visual changes. ENT: No sore throat.  Dental pain. Cardiovascular: Denies chest pain. Respiratory: Denies shortness of breath. Gastrointestinal: No abdominal pain.  No nausea, no vomiting.  No diarrhea.  No constipation. Genitourinary: Negative for dysuria. Musculoskeletal: Negative for back pain. Skin: Negative for rash.  Right facial edema. Neurological: Negative for headaches, focal weakness or numbness. ____________________________________________   PHYSICAL EXAM:  VITAL SIGNS: ED Triage Vitals  Enc Vitals Group     BP  Pulse      Resp      Temp      Temp src      SpO2      Weight      Height      Head Circumference      Peak Flow      Pain Score      Pain Loc      Pain Edu?      Excl. in GC?     Constitutional: Alert and oriented.  Moderate distress.   Eyes: Conjunctivae are normal. PERRL. EOMI. Head: Atraumatic. Nose: No congestion/rhinnorhea. Mouth/Throat: Mucous membranes are moist.  Oropharynx non-erythematous.  Multiple upper molar fracture with gingival edema. Neck: No stridor.   Hematological/Lymphatic/Immunilogical: No cervical lymphadenopathy. Cardiovascular: Normal rate, regular rhythm. Grossly normal heart sounds.  Good peripheral circulation. Respiratory: Normal respiratory effort.  No retractions. Lungs CTAB. Neurologic:  Normal speech and language. No gross focal neurologic deficits are appreciated. No gait instability. Skin:  Skin is warm, dry and intact. No rash noted. Psychiatric: Mood and affect are normal. Speech and behavior are normal.  ____________________________________________   LABS (all labs ordered are listed, but only abnormal results are displayed)  Labs Reviewed  CBC WITH DIFFERENTIAL/PLATELET - Abnormal; Notable for the following components:      Result Value   WBC 14.4 (*)    Neutro Abs 10.7 (*)    Eosinophils Absolute 0.6 (*)    All other components within normal limits  BASIC METABOLIC PANEL   ____________________________________________  EKG   ____________________________________________  RADIOLOGY  ED MD interpretation:    Official radiology report(s): No results found.  ____________________________________________   PROCEDURES  Procedure(s) performed (including Critical Care):  Procedures   ____________________________________________   INITIAL IMPRESSION / ASSESSMENT AND PLAN / ED COURSE  As part of my medical decision making, I reviewed the following data within the electronic MEDICAL RECORD NUMBER         Gabriela Woodard was evaluated in Emergency Department on 03/21/2019 for the symptoms described in the history of present illness. She was evaluated in the context of the global COVID-19 pandemic, which necessitated consideration that the patient might be at risk for infection with the SARS-CoV-2 virus that causes COVID-19. Institutional protocols and algorithms that pertain to the evaluation of patients at risk for COVID-19 are in a state of rapid change based on information released by regulatory bodies  including the CDC and federal and state organizations. These policies and algorithms were followed during the patient's care in the ED.  Patient presents with facial swelling secondary dental abscess.  Discussed lab results with patient.  Patient started on IV clindamycin and will be switched to oral medication upon departure.  Patient advised to contact treating dentist and let them know we have started the antibiotics.      ____________________________________________   FINAL CLINICAL IMPRESSION(S) / ED DIAGNOSES  Final diagnoses:  Dental abscess     ED Discharge Orders         Ordered    clindamycin (CLEOCIN) 300 MG capsule  3 times daily     03/21/19 1158    traMADol (ULTRAM) 50 MG tablet  Every 6 hours PRN     03/21/19 1158    ibuprofen (ADVIL) 600 MG tablet  Every 8 hours PRN     03/21/19 1158           Note:  This document was prepared using Dragon voice recognition software and may include unintentional  dictation errors.    Joni Reining, PA-C 03/21/19 1200    Willy Eddy, MD 03/21/19 [id number]

## 2020-02-06 ENCOUNTER — Ambulatory Visit: Payer: Medicaid Other | Attending: Internal Medicine

## 2020-02-06 DIAGNOSIS — Z23 Encounter for immunization: Secondary | ICD-10-CM

## 2020-02-06 NOTE — Progress Notes (Signed)
   Covid-19 Vaccination Clinic  Name:  Gabriela Woodard    MRN: [id number] DOB: June 29, 1985  02/06/2020  Gabriela Woodard was observed post Covid-19 immunization for 15 minutes without incident. She was provided with Vaccine Information Sheet and instruction to access the V-Safe system.   Gabriela Woodard was instructed to call 911 with any severe reactions post vaccine: Marland Kitchen Difficulty breathing  . Swelling of face and throat  . A fast heartbeat  . A bad rash all over body  . Dizziness and weakness   Immunizations Administered    Name Date Dose VIS Date Route   Pfizer COVID-19 Vaccine 02/06/2020 12:21 PM 0.3 mL 08/17/2018 Intramuscular   Manufacturer: [employer], [employer]   Lot: [id number]   NDC: [address]-6

## 2020-03-05 ENCOUNTER — Ambulatory Visit: Payer: Medicaid Other

## 2020-07-01 ENCOUNTER — Other Ambulatory Visit: Payer: Self-pay

## 2020-07-01 ENCOUNTER — Emergency Department
Admission: EM | Admit: 2020-07-01 | Discharge: 2020-07-01 | Disposition: A | Discharge status: Home / Self Care | Payer: Medicaid Other | Attending: Emergency Medicine | Admitting: Emergency Medicine

## 2020-07-01 DIAGNOSIS — F17210 Nicotine dependence, cigarettes, uncomplicated: Secondary | ICD-10-CM | POA: Diagnosis not present

## 2020-07-01 DIAGNOSIS — T402X1A Poisoning by other opioids, accidental (unintentional), initial encounter: Secondary | ICD-10-CM | POA: Diagnosis present

## 2020-07-01 DIAGNOSIS — T40601A Poisoning by unspecified narcotics, accidental (unintentional), initial encounter: Secondary | ICD-10-CM

## 2020-07-01 LAB — CBC WITH DIFFERENTIAL/PLATELET
Abs Immature Granulocytes: 0.11 10*3/uL — ABNORMAL HIGH (ref 0.00–0.07)
Basophils Absolute: 0.1 10*3/uL (ref 0.0–0.1)
Basophils Relative: 1 %
Eosinophils Absolute: 0.1 10*3/uL (ref 0.0–0.5)
Eosinophils Relative: 1 %
HCT: 40.1 % (ref 36.0–46.0)
Hemoglobin: 13.3 g/dL (ref 12.0–15.0)
Immature Granulocytes: 1 %
Lymphocytes Relative: 10 %
Lymphs Abs: 2 10*3/uL (ref 0.7–4.0)
MCH: 30.4 pg (ref 26.0–34.0)
MCHC: 33.2 g/dL (ref 30.0–36.0)
MCV: 91.6 fL (ref 80.0–100.0)
Monocytes Absolute: 0.6 10*3/uL (ref 0.1–1.0)
Monocytes Relative: 3 %
Neutro Abs: 17 10*3/uL — ABNORMAL HIGH (ref 1.7–7.7)
Neutrophils Relative %: 84 %
Platelets: 338 10*3/uL (ref 150–400)
RBC: 4.38 MIL/uL (ref 3.87–5.11)
RDW: 14.4 % (ref 11.5–15.5)
WBC: 20 10*3/uL — ABNORMAL HIGH (ref 4.0–10.5)
nRBC: 0 % (ref 0.0–0.2)

## 2020-07-01 LAB — BASIC METABOLIC PANEL
Anion gap: 11 (ref 5–15)
BUN: 12 mg/dL (ref 6–20)
CO2: 26 mmol/L (ref 22–32)
Calcium: 8.6 mg/dL — ABNORMAL LOW (ref 8.9–10.3)
Chloride: 104 mmol/L (ref 98–111)
Creatinine, Ser: 1.05 mg/dL — ABNORMAL HIGH (ref 0.44–1.00)
GFR, Estimated: >60 mL/min (ref >60)
Glucose, Bld: 185 mg/dL — ABNORMAL HIGH (ref 70–99)
Potassium: 4.4 mmol/L (ref 3.5–5.1)
Sodium: 141 mmol/L (ref 135–145)

## 2020-07-01 LAB — ETHANOL: Alcohol, Ethyl (B): <10 mg/dL (ref <10)

## 2020-07-01 NOTE — ED Triage Notes (Signed)
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  Patient was given narcan by EMS.  Patient is wake and alert at this time.

## 2020-07-01 NOTE — ED Provider Notes (Signed)
[facility] Emergency Department Provider Note   ____________________________________________    I have reviewed the triage vital signs and the nursing notes.   HISTORY  Chief Complaint Drug Overdose     HPI Gabriela Woodard is a 35 y.o. female who presents after accidental drug overdose.  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  Apparently her breathing was slow so her friends called EMS, they gave her Narcan and she rapidly returned to normal mental status.  She feels at baseline now she is upset that she relapsed.  No chest pain or shortness of breath.  Past Medical History:  Diagnosis Date  . Seizures ([facility])    1 seizure at age 40    Patient Active Problem List   Diagnosis Date Noted  . Delivery by elective cesarean section 03/13/2017  . Smoking (tobacco) complicating childbirth 03/13/2017  . Insufficient prenatal care 03/13/2017  . Supervision of high risk pregnancy in third trimester 03/13/2017  . H/O gestational diabetes in prior pregnancy, currently pregnant 11/29/2012  . Rh negative state in antepartum period 10/26/2012  . Previous cesarean section 10/25/2012    Past Surgical History:  Procedure Laterality Date  . CESAREAN SECTION    . CESAREAN SECTION WITH BILATERAL TUBAL LIGATION Bilateral 03/13/2017   Procedure: CESAREAN SECTION WITH BILATERAL TUBAL LIGATION;  Surgeon: Ward, Elenora Fender, MD;  Location: [facility] ORS;  Service: Obstetrics;  Laterality: Bilateral;    Prior to Admission medications   Medication Sig Start Date End Date Taking? Authorizing Provider  ibuprofen (ADVIL) 600 MG tablet Take 1 tablet (600 mg total) by mouth every 8 (eight) hours as needed. 03/21/19   Joni Reining, PA-C     Allergies Patient has no known allergies.  Family History  Problem Relation Age of Onset  . Cancer Maternal Grandmother        breast  . Cancer Other        breast; maternal grandma's sister.   . Diabetes Mellitus II Paternal Grandmother     Social History Social History   Tobacco Use  . Smoking status: Current Every Day Smoker    Packs/day: 1.00    Years: 15.00    Pack years: 15.00    Types: Cigarettes  . Smokeless tobacco: Never Used  Vaping Use  . Vaping Use: Never used  Substance Use Topics  . Alcohol use: No  . Drug use: No    Review of Systems  Constitutional: No fever/chills Eyes: No visual changes.  ENT: No sore throat. Cardiovascular: Denies chest pain. Respiratory: Denies shortness of breath. Gastrointestinal: No abdominal pain.  No nausea, no vomiting.   Genitourinary: Negative for dysuria. Musculoskeletal: Negative for back pain. Skin: Negative for rash. Neurological: Negative for headaches or weakness   ____________________________________________   PHYSICAL EXAM:  VITAL SIGNS: ED Triage Vitals  Enc Vitals Group     BP 07/01/20 2028 114/74     Pulse Rate 07/01/20 2028 90     Resp 07/01/20 2028 16     Temp 07/01/20 2028 98 F (36.7 C)     Temp Source 07/01/20 2028 Oral     SpO2 07/01/20 2028 97 %     Weight 07/01/20 2029 61.2 kg (135 lb)     Height 07/01/20 2029 1.549 m (5\' 1" )     Head Circumference --      Peak Flow --      Pain Score 07/01/20 2200 0  Pain Loc --      Pain Edu? --      Excl. in GC? --     Constitutional: Alert and oriented. No acute distress.  Nose: No congestion/rhinnorhea. Mouth/Throat: Mucous membranes are moist.   Neck:  Painless ROM Cardiovascular: Normal rate, regular rhythm. Grossly normal heart sounds.  Good peripheral circulation. Respiratory: Normal respiratory effort.  No retractions.  Gastrointestinal: Soft and nontender. No distention.  No CVA tenderness.  Musculoskeletal: No lower extremity tenderness nor edema.  Warm and well perfused Neurologic:  Normal speech and language. No gross focal neurologic deficits are appreciated.  Skin:  Skin is warm, dry and intact. No rash  noted. Psychiatric: Mood and affect are normal. Speech and behavior are normal.  ____________________________________________   LABS (all labs ordered are listed, but only abnormal results are displayed)  Labs Reviewed  CBC WITH DIFFERENTIAL/PLATELET - Abnormal; Notable for the following components:      Result Value   WBC 20.0 (*)    Neutro Abs 17.0 (*)    Abs Immature Granulocytes 0.11 (*)    All other components within normal limits  BASIC METABOLIC PANEL - Abnormal; Notable for the following components:   Glucose, Bld 185 (*)    Creatinine, Ser 1.05 (*)    Calcium 8.6 (*)    All other components within normal limits  ETHANOL  URINE DRUG SCREEN, QUALITATIVE ([facility] ONLY)  POC URINE PREG, ED   ____________________________________________  EKG  ED ECG REPORT I, Jene Every, the attending physician, personally viewed and interpreted this ECG.  Date: 07/01/2020  Rhythm: normal sinus rhythm QRS Axis: normal Intervals: normal ST/T Wave abnormalities: normal Narrative Interpretation: no evidence of acute ischemia  ____________________________________________  RADIOLOGY None ____________________________________________   PROCEDURES  Procedure(s) performed: No  Procedures   Critical Care performed: No ____________________________________________   INITIAL IMPRESSION / ASSESSMENT AND PLAN / ED COURSE  Pertinent labs & imaging results that were available during my care of the patient were reviewed by me and considered in my medical decision making (see chart for details).  Patient well-appearing and in no acute distress, vital signs are reassuring, exam is unremarkable.  Lab work notable for elevated white blood cell count, this appears to be chronic for her after review of medical records.  No shortness of breath or chest pain  This was an accidental overdose.  Appropriate for discharge after brief period of ED observation     ____________________________________________   FINAL CLINICAL IMPRESSION(S) / ED DIAGNOSES  Final diagnoses:  Opiate overdose, accidental or unintentional, initial encounter [facility])        Note:  This document was prepared using Dragon voice recognition software and may include unintentional dictation errors.   Jene Every, MD 07/01/20 [id number]

## 2020-07-02 ENCOUNTER — Other Ambulatory Visit: Payer: Self-pay

## 2020-07-02 ENCOUNTER — Encounter: Payer: Self-pay | Admitting: Emergency Medicine

## 2020-07-02 ENCOUNTER — Emergency Department
Admission: EM | Admit: 2020-07-02 | Discharge: 2020-07-02 | Disposition: A | Discharge status: Home / Self Care | Payer: Medicaid Other | Attending: Emergency Medicine | Admitting: Emergency Medicine

## 2020-07-02 DIAGNOSIS — R110 Nausea: Secondary | ICD-10-CM | POA: Diagnosis present

## 2020-07-02 DIAGNOSIS — F1123 Opioid dependence with withdrawal: Secondary | ICD-10-CM | POA: Insufficient documentation

## 2020-07-02 DIAGNOSIS — F17210 Nicotine dependence, cigarettes, uncomplicated: Secondary | ICD-10-CM | POA: Diagnosis not present

## 2020-07-02 DIAGNOSIS — E860 Dehydration: Secondary | ICD-10-CM | POA: Insufficient documentation

## 2020-07-02 DIAGNOSIS — F1193 Opioid use, unspecified with withdrawal: Secondary | ICD-10-CM

## 2020-07-02 DIAGNOSIS — R519 Headache, unspecified: Secondary | ICD-10-CM | POA: Diagnosis not present

## 2020-07-02 MED ORDER — SODIUM CHLORIDE 0.9 % IV BOLUS
1000.0000 mL | Freq: Once | INTRAVENOUS | Status: AC
  Administered 2020-07-02: 1000 mL via INTRAVENOUS

## 2020-07-02 MED ORDER — ONDANSETRON HCL 4 MG/2ML IJ SOLN
4.0000 mg | Freq: Once | INTRAMUSCULAR | Status: AC
  Administered 2020-07-02: 4 mg via INTRAVENOUS
  Filled 2020-07-02: qty 2

## 2020-07-02 MED ORDER — KETOROLAC TROMETHAMINE 10 MG PO TABS: 10.0000 mg | ORAL_TABLET | Freq: Four times a day (QID) | ORAL | 0 refills | Status: DC | PRN

## 2020-07-02 MED ORDER — ONDANSETRON HCL 8 MG PO TABS: 8.0000 mg | ORAL_TABLET | Freq: Three times a day (TID) | ORAL | 0 refills | Status: DC | PRN

## 2020-07-02 MED ORDER — KETOROLAC TROMETHAMINE 30 MG/ML IJ SOLN
30.0000 mg | Freq: Once | INTRAMUSCULAR | Status: AC
  Administered 2020-07-02: 30 mg via INTRAVENOUS
  Filled 2020-07-02: qty 1

## 2020-07-02 NOTE — Discharge Instructions (Signed)
Follow discharge care instructions and take medication as directed. 

## 2020-07-02 NOTE — ED Notes (Signed)
This RN discussed care with Dr. Lenard Lance, per Dr. Lenard Lance no new orders at this time.

## 2020-07-02 NOTE — ED Provider Notes (Signed)
[facility] Emergency Department Provider Note   ____________________________________________   Event Date/Time   First MD Initiated Contact with Patient 07/02/20 1557     (approximate)  I have reviewed the triage vital signs and the nursing notes.   HISTORY  Chief Complaint Headache and Nausea    HPI Gabriela Woodard is a 35 y.o. female patient complain of headache and nausea.  Patient was seen the facility yesterday for accidental last intentional overdose of heroin.  Patient was discharged but spent the night in emergency room.  Patient state when she tried to leave this morning started experiencing headache and nausea.  Patient also state photophobia.  Patient rates the headaches a 10/10.  Described pain as "achy".      Past Medical History:  Diagnosis Date  . Seizures ([facility])    1 seizure at age 59    Patient Active Problem List   Diagnosis Date Noted  . Delivery by elective cesarean section 03/13/2017  . Smoking (tobacco) complicating childbirth 03/13/2017  . Insufficient prenatal care 03/13/2017  . Supervision of high risk pregnancy in third trimester 03/13/2017  . H/O gestational diabetes in prior pregnancy, currently pregnant 11/29/2012  . Rh negative state in antepartum period 10/26/2012  . Previous cesarean section 10/25/2012    Past Surgical History:  Procedure Laterality Date  . CESAREAN SECTION    . CESAREAN SECTION WITH BILATERAL TUBAL LIGATION Bilateral 03/13/2017   Procedure: CESAREAN SECTION WITH BILATERAL TUBAL LIGATION;  Surgeon: Ward, Elenora Fender, MD;  Location: [facility] ORS;  Service: Obstetrics;  Laterality: Bilateral;    Prior to Admission medications   Medication Sig Start Date End Date Taking? Authorizing Provider  ketorolac (TORADOL) 10 MG tablet Take 1 tablet (10 mg total) by mouth every 6 (six) hours as needed. 07/02/20  Yes Joni Reining, PA-C  ondansetron (ZOFRAN) 8 MG tablet Take 1 tablet (8 mg total) by mouth every 8  (eight) hours as needed for nausea or vomiting. 07/02/20  Yes Joni Reining, PA-C  ibuprofen (ADVIL) 600 MG tablet Take 1 tablet (600 mg total) by mouth every 8 (eight) hours as needed. 03/21/19   Joni Reining, PA-C    Allergies Patient has no known allergies.  Family History  Problem Relation Age of Onset  . Cancer Maternal Grandmother        breast  . Cancer Other        breast; maternal grandma's sister.  . Diabetes Mellitus II Paternal Grandmother     Social History Social History   Tobacco Use  . Smoking status: Current Every Day Smoker    Packs/day: 1.00    Years: 15.00    Pack years: 15.00    Types: Cigarettes  . Smokeless tobacco: Never Used  Vaping Use  . Vaping Use: Never used  Substance Use Topics  . Alcohol use: No  . Drug use: No    Review of Systems Constitutional: No fever/chills Eyes: Photophobic.   ENT: No sore throat. Cardiovascular: Denies chest pain. Respiratory: Denies shortness of breath. Gastrointestinal: No abdominal pain.  Nausea without vomiting.  No diarrhea.  No constipation. Genitourinary: Negative for dysuria. Musculoskeletal: Negative for back pain. Skin: Negative for rash. Neurological: Positive for headaches, but denies focal weakness or numbness. Psychiatric:  Drug abuse   ____________________________________________   PHYSICAL EXAM:  VITAL SIGNS: ED Triage Vitals  Enc Vitals Group     BP 07/02/20 1246 113/71     Pulse Rate 07/02/20 1246 86  Resp 07/02/20 1246 18     Temp 07/02/20 1246 98.7 F (37.1 C)     Temp Source 07/02/20 1246 Oral     SpO2 07/02/20 1246 100 %     Weight 07/02/20 1306 135 lb (61.2 kg)     Height 07/02/20 1306 5\' 1"  (1.549 m)     Head Circumference --      Peak Flow --      Pain Score 07/02/20 1306 10     Pain Loc --      Pain Edu? --      Excl. in GC? --     Constitutional: Alert and oriented. Well appearing and in no acute distress. Eyes: Photophobic. Head: Atraumatic. Nose: No  congestion/rhinnorhea. Mouth/Throat: Mucous membranes are dry.  Oropharynx non-erythematous. Neck: No stridor.  Cardiovascular: Normal rate, regular rhythm. Grossly normal heart sounds.  Good peripheral circulation. Respiratory: Normal respiratory effort.  No retractions. Lungs CTAB. Gastrointestinal: Soft and nontender. No distention. No abdominal bruits. No CVA tenderness. Genitourinary: Deferred Musculoskeletal: No lower extremity tenderness nor edema.  No joint effusions. Neurologic:  Normal speech and language. No gross focal neurologic deficits are appreciated. No gait instability. Skin:  Skin is warm, dry and intact. No rash noted. Psychiatric: Mood and affect are normal. Speech and behavior are normal.  ____________________________________________   LABS (all labs ordered are listed, but only abnormal results are displayed)  Labs Reviewed - No data to display ____________________________________________  EKG   ____________________________________________  RADIOLOGY I, 08/30/20, personally viewed and evaluated these images (plain radiographs) as part of my medical decision making, as well as reviewing the written report by the radiologist.  ED MD interpretation:    Official radiology report(s): No results found.  ____________________________________________   PROCEDURES  Procedure(s) performed (including Critical Care):  Procedures   ____________________________________________   INITIAL IMPRESSION / ASSESSMENT AND PLAN / ED COURSE  As part of my medical decision making, I reviewed the following data within the electronic MEDICAL RECORD NUMBER         Patient presents with headache and nausea status post opiate overdose yesterday.  Differentials consist of drug withdrawal versus dehydration.      ____________________________________________   FINAL CLINICAL IMPRESSION(S) / ED DIAGNOSES  Final diagnoses:  Nausea  Dehydration, mild  Heroin  withdrawal [facility])     ED Discharge Orders         Ordered    ondansetron (ZOFRAN) 8 MG tablet  Every 8 hours PRN        07/02/20 1804    ketorolac (TORADOL) 10 MG tablet  Every 6 hours PRN        07/02/20 1804          *Please note:  Gabriela Woodard was evaluated in Emergency Department on 07/02/2020 for the symptoms described in the history of present illness. She was evaluated in the context of the global COVID-19 pandemic, which necessitated consideration that the patient might be at risk for infection with the SARS-CoV-2 virus that causes COVID-19. Institutional protocols and algorithms that pertain to the evaluation of patients at risk for COVID-19 are in a state of rapid change based on information released by regulatory bodies including the CDC and federal and state organizations. These policies and algorithms were followed during the patient's care in the ED.  Some ED evaluations and interventions may be delayed as a result of limited staffing during and the pandemic.*   Note:  This document was prepared using Dragon voice  recognition software and may include unintentional dictation errors.    Joni Reining, PA-C 07/02/20 1811    Dionne Bucy, MD 07/03/20 1044

## 2020-07-02 NOTE — ED Triage Notes (Signed)
Pt to ED yesterday with c/o accidental overdose. Pt states had been clean x 6 months and used heroin, per triage note from 1/9 pt given narcan last night. Pt c/o nausea and HA at this time. Pt A&O x4. Pt spitting into emesis bag in triage at this time.    Pt states "my head is hurting so bad I think I'm going to have a seizure".

## 2020-07-02 NOTE — ED Notes (Signed)
Located patient in [place].

## 2020-10-09 ENCOUNTER — Other Ambulatory Visit: Payer: Self-pay

## 2020-10-09 ENCOUNTER — Emergency Department
Admission: EM | Admit: 2020-10-09 | Discharge: 2020-10-09 | Disposition: A | Discharge status: Home / Self Care | Payer: Medicaid Other | Attending: Emergency Medicine | Admitting: Emergency Medicine

## 2020-10-09 DIAGNOSIS — K0889 Other specified disorders of teeth and supporting structures: Secondary | ICD-10-CM | POA: Diagnosis present

## 2020-10-09 DIAGNOSIS — K029 Dental caries, unspecified: Secondary | ICD-10-CM | POA: Diagnosis not present

## 2020-10-09 DIAGNOSIS — F17210 Nicotine dependence, cigarettes, uncomplicated: Secondary | ICD-10-CM | POA: Insufficient documentation

## 2020-10-09 MED ORDER — IBUPROFEN 600 MG PO TABS: 600.0000 mg | ORAL_TABLET | Freq: Four times a day (QID) | ORAL | 0 refills | Status: DC | PRN

## 2020-10-09 MED ORDER — AMOXICILLIN 875 MG PO TABS: 875.0000 mg | ORAL_TABLET | Freq: Two times a day (BID) | ORAL | 0 refills | Status: DC

## 2020-10-09 NOTE — Discharge Instructions (Signed)
Follow-up with your primary care provider or [facility] acute care to have your blood pressure rechecked as it was elevated in the emergency department which may be related to pain.  A prescription for amoxicillin and ibuprofen was sent to the pharmacy to begin taking prior to your dentist appointment.

## 2020-10-09 NOTE — ED Notes (Signed)
NAD noted at time of D/C. Pt denies questions or concerns. Pt ambulatory to the lobby at this time. Verbal consent for D/C obtained at this time.  

## 2020-10-09 NOTE — ED Provider Notes (Signed)
[facility] Emergency Department Provider Note   ____________________________________________   Event Date/Time   First MD Initiated Contact with Patient 10/09/20 1432     (approximate)  I have reviewed the triage vital signs and the nursing notes.   HISTORY  Chief Complaint Dental Pain   HPI Gabriela Woodard is a 35 y.o. female presents to the ED with complaint of dental pain.  Patient states she has an appointment with dental works next week.  She denies any fever chills but does report some swelling sensation to the right side of her face.  Only she rates her pain as 10/10.       Past Medical History:  Diagnosis Date  . Seizures (HCC)    1 seizure at age 50    Patient Active Problem List   Diagnosis Date Noted  . Delivery by elective cesarean section 03/13/2017  . Smoking (tobacco) complicating childbirth 03/13/2017  . Insufficient prenatal care 03/13/2017  . Supervision of high risk pregnancy in third trimester 03/13/2017  . H/O gestational diabetes in prior pregnancy, currently pregnant 11/29/2012  . Rh negative state in antepartum period 10/26/2012  . Previous cesarean section 10/25/2012    Past Surgical History:  Procedure Laterality Date  . CESAREAN SECTION    . CESAREAN SECTION WITH BILATERAL TUBAL LIGATION Bilateral 03/13/2017   Procedure: CESAREAN SECTION WITH BILATERAL TUBAL LIGATION;  Surgeon: Ward, Elenora Fender, MD;  Location: [facility] ORS;  Service: Obstetrics;  Laterality: Bilateral;    Prior to Admission medications   Medication Sig Start Date End Date Taking? Authorizing Provider  amoxicillin (AMOXIL) 875 MG tablet Take 1 tablet (875 mg total) by mouth 2 (two) times daily. 10/09/20  Yes Bridget Hartshorn L, PA-C  ibuprofen (ADVIL) 600 MG tablet Take 1 tablet (600 mg total) by mouth every 6 (six) hours as needed. 10/09/20  Yes Tommi Rumps, PA-C    Allergies Patient has no known allergies.  Family History  Problem Relation Age  of Onset  . Cancer Maternal Grandmother        breast  . Cancer Other        breast; maternal grandma's sister.  . Diabetes Mellitus II Paternal Grandmother     Social History Social History   Tobacco Use  . Smoking status: Current Every Day Smoker    Packs/day: 1.00    Years: 15.00    Pack years: 15.00    Types: Cigarettes  . Smokeless tobacco: Never Used  Vaping Use  . Vaping Use: Never used  Substance Use Topics  . Alcohol use: No  . Drug use: No    Review of Systems Constitutional: No fever/chills Eyes: No visual changes. ENT: Positive for dental pain. Cardiovascular: Denies chest pain. Respiratory: Denies shortness of breath. Gastrointestinal: No abdominal pain.  No nausea, no vomiting.   Musculoskeletal: Negative for muscle aches. Skin: Negative for rash. Neurological: Negative for headaches, focal weakness or numbness. ____________________________________________   PHYSICAL EXAM:  VITAL SIGNS: ED Triage Vitals  Enc Vitals Group     BP 10/09/20 1414 (!) 137/107     Pulse Rate 10/09/20 1414 79     Resp 10/09/20 1414 17     Temp 10/09/20 1414 97.9 F (36.6 C)     Temp Source 10/09/20 1414 Oral     SpO2 10/09/20 1414 98 %     Weight 10/09/20 1415 140 lb (63.5 kg)     Height 10/09/20 1415 5\' 1"  (1.549 m)  Head Circumference --      Peak Flow --      Pain Score 10/09/20 1415 10     Pain Loc --      Pain Edu? --      Excl. in GC? --     Constitutional: Alert and oriented. Well appearing and in no acute distress. Eyes: Conjunctivae are normal.  Head: Atraumatic. Nose: No congestion/rhinnorhea. Mouth/Throat: Mucous membranes are moist.  Oropharynx non-erythematous.  Right upper premolar and molars are in very poor repair and hygiene.  There is tenderness and edema to the gums surrounding multiple teeth.  No drainage is noted at this time. Neck: No stridor.   Cardiovascular: Normal rate, regular rhythm. Grossly normal heart sounds.  Good peripheral  circulation. Respiratory: Normal respiratory effort.  No retractions. Lungs CTAB. Musculoskeletal: Ambulatory without any assistance. Neurologic:  Normal speech and language. No gross focal neurologic deficits are appreciated. No gait instability. Skin:  Skin is warm, dry and intact. No rash noted. Psychiatric: Mood and affect are normal. Speech and behavior are normal.  ____________________________________________   LABS (all labs ordered are listed, but only abnormal results are displayed)  Labs Reviewed - No data to display ____________________________________________  PROCEDURES  Procedure(s) performed (including Critical Care):  Procedures   ____________________________________________   INITIAL IMPRESSION / ASSESSMENT AND PLAN / ED COURSE  As part of my medical decision making, I reviewed the following data within the electronic MEDICAL RECORD NUMBER Notes from prior ED visits and [place] Controlled Substance Database  35 year old female presents to the ED with complaint of right upper dental pain.  Patient states that she has an appointment with dental works next week.  On exam there are multiple teeth in poor repair and hygiene.  Gums are edematous around these teeth but no active drainage was noted.  Patient is strongly encouraged to keep her appointment for next week.  Prescription for amoxicillin and ibuprofen was sent to her pharmacy.  ____________________________________________   FINAL CLINICAL IMPRESSION(S) / ED DIAGNOSES  Final diagnoses:  Pain due to dental caries     ED Discharge Orders         Ordered    amoxicillin (AMOXIL) 875 MG tablet  2 times daily        10/09/20 1446    ibuprofen (ADVIL) 600 MG tablet  Every 6 hours PRN        10/09/20 1446          *Please note:  ADYA WIRZ was evaluated in Emergency Department on 10/09/2020 for the symptoms described in the history of present illness. She was evaluated in the context of the global COVID-19  pandemic, which necessitated consideration that the patient might be at risk for infection with the SARS-CoV-2 virus that causes COVID-19. Institutional protocols and algorithms that pertain to the evaluation of patients at risk for COVID-19 are in a state of rapid change based on information released by regulatory bodies including the CDC and federal and state organizations. These policies and algorithms were followed during the patient's care in the ED.  Some ED evaluations and interventions may be delayed as a result of limited staffing during and the pandemic.*   Note:  This document was prepared using Dragon voice recognition software and may include unintentional dictation errors.    Tommi Rumps, PA-C 10/09/20 1449    Minna Antis, MD 10/09/20 [phone]

## 2020-10-09 NOTE — ED Triage Notes (Signed)
Pt c/o right upper tooth pain for the past couple of days with some facial swelling

## 2021-01-14 ENCOUNTER — Emergency Department
Admission: EM | Admit: 2021-01-14 | Discharge: 2021-01-14 | Disposition: A | Discharge status: Home / Self Care | Payer: Medicaid Other | Source: Home / Self Care | Attending: Emergency Medicine | Admitting: Emergency Medicine

## 2021-01-14 ENCOUNTER — Other Ambulatory Visit: Payer: Self-pay

## 2021-01-14 DIAGNOSIS — K047 Periapical abscess without sinus: Secondary | ICD-10-CM | POA: Insufficient documentation

## 2021-01-14 DIAGNOSIS — F17210 Nicotine dependence, cigarettes, uncomplicated: Secondary | ICD-10-CM | POA: Insufficient documentation

## 2021-01-14 MED ORDER — AMOXICILLIN 875 MG PO TABS: 875.0000 mg | ORAL_TABLET | Freq: Two times a day (BID) | ORAL | 0 refills | Status: DC

## 2021-01-14 MED ORDER — HYDROCODONE-ACETAMINOPHEN 5-325 MG PO TABS: 1.0000 | ORAL_TABLET | Freq: Four times a day (QID) | ORAL | 0 refills | Status: DC | PRN

## 2021-01-14 NOTE — ED Triage Notes (Signed)
Pt via POV from home. Pt states that she has bad teeth and needs to get them pulled. Pt started noticing swelling in the R lower jaw. Pt is A&Ox4 and NAD.

## 2021-01-14 NOTE — Discharge Instructions (Addendum)
Follow up with the dentist as soon as possible.  Return to the ER for symptoms that change or worsen if unable to see the dentist.

## 2021-01-14 NOTE — ED Provider Notes (Signed)
Emergency Medicine Provider Triage Evaluation Note  Gabriela Woodard , a 35 y.o. female  was evaluated in triage.  Pt complains of lower jaw swelling. She admits to poor dentition. She denies FCS or purulent drainage.  Review of Systems  Positive: Jaw swelling Negative: FCS  Physical Exam  Ht 5\' 1"  (1.549 m)   Wt 56.7 kg   LMP 01/05/2021   BMI 23.62 kg/m  Gen:   Awake, no distress  afebrile Resp:  Normal effort CTA MSK:   Moves extremities without difficulty  Other:  Lower right jaw swelling. No sublingual edema  Medical Decision Making  Medically screening exam initiated at 3:59 PM.  Appropriate orders placed.  Gabriela Woodard was informed that the remainder of the evaluation will be completed by another provider, this initial triage assessment does not replace that evaluation, and the importance of remaining in the ED until their evaluation is complete.     [name], PA-C 01/14/21 1602    01/16/21, MD 01/14/21 [phone]

## 2021-01-14 NOTE — ED Provider Notes (Signed)
[facility] Emergency Department Provider Note ____________________________________________  Time seen: Approximately 6:48 PM  I have reviewed the triage vital signs and the nursing notes.   HISTORY  Chief Complaint Dental Pain   HPI Gabriela Woodard is a 35 y.o. female presents to the ER for dental pain and facial swelling. Symptoms started yesterday and worsened overnight and throughout the day. No relief with OTC medications and dental rinse. No fever.   Past Medical History:  Diagnosis Date   Seizures (HCC)    1 seizure at age 39    Patient Active Problem List   Diagnosis Date Noted   Delivery by elective cesarean section 03/13/2017   Smoking (tobacco) complicating childbirth 03/13/2017   Insufficient prenatal care 03/13/2017   Supervision of high risk pregnancy in third trimester 03/13/2017   H/O gestational diabetes in prior pregnancy, currently pregnant 11/29/2012   Rh negative state in antepartum period 10/26/2012   Previous cesarean section 10/25/2012    Past Surgical History:  Procedure Laterality Date   CESAREAN SECTION     CESAREAN SECTION WITH BILATERAL TUBAL LIGATION Bilateral 03/13/2017   Procedure: CESAREAN SECTION WITH BILATERAL TUBAL LIGATION;  Surgeon: Ward, Elenora Fender, MD;  Location: [facility] ORS;  Service: Obstetrics;  Laterality: Bilateral;    Prior to Admission medications   Medication Sig Start Date End Date Taking? Authorizing Provider  HYDROcodone-acetaminophen (NORCO/VICODIN) 5-325 MG tablet Take 1 tablet by mouth every 6 (six) hours as needed for up to 3 days for severe pain. 01/14/21 01/17/21 Yes Giann Obara B, FNP  amoxicillin (AMOXIL) 875 MG tablet Take 1 tablet (875 mg total) by mouth 2 (two) times daily. 01/14/21   Zeddie Njie, Rulon Eisenmenger B, FNP  ibuprofen (ADVIL) 600 MG tablet Take 1 tablet (600 mg total) by mouth every 6 (six) hours as needed. 10/09/20   Tommi Rumps, PA-C    Allergies Patient has no known  allergies.  Family History  Problem Relation Age of Onset   Cancer Maternal Grandmother        breast   Cancer Other        breast; maternal grandma's sister.   Diabetes Mellitus II Paternal Grandmother     Social History Social History   Tobacco Use   Smoking status: Every Day    Packs/day: 1.00    Years: 15.00    Pack years: 15.00    Types: Cigarettes   Smokeless tobacco: Never  Vaping Use   Vaping Use: Never used  Substance Use Topics   Alcohol use: No   Drug use: No    Review of Systems Constitutional: Negative for fever or recent illness. ENT: Positive for dental pain. Musculoskeletal: Negative for trismus of the jaw.  Skin: Negative for wound or lesion. ____________________________________________   PHYSICAL EXAM:  VITAL SIGNS: ED Triage Vitals  Enc Vitals Group     BP 01/14/21 1559 (!) 140/98     Pulse Rate 01/14/21 1559 81     Resp 01/14/21 1559 20     Temp 01/14/21 1559 98.4 F (36.9 C)     Temp Source 01/14/21 1559 Oral     SpO2 01/14/21 1559 100 %     Weight 01/14/21 1557 125 lb (56.7 kg)     Height 01/14/21 1557 5\' 1"  (1.549 m)     Head Circumference --      Peak Flow --      Pain Score 01/14/21 1557 10     Pain Loc --  Pain Edu? --      Excl. in GC? --     Constitutional: Alert and oriented. Well appearing and in no acute distress. Eyes: Conjunctiva are clear without discharge or drainage. Mouth/Throat: Widespread dental decay. Facial swelling right lower jaw. Hematological/Lymphatic/Immunilogical: No palpable adenopathy of neck. Respiratory: Respirations even and unlabored. Musculoskeletal: Full ROM of the jaw. Neurologic: Awake, alert, oriented.  Skin:  No erythema or obvious area of fluctuance Psychiatric: Affect and behavior intact.  ____________________________________________   LABS (all labs ordered are listed, but only abnormal results are displayed)  Labs Reviewed - No data to  display ____________________________________________   RADIOLOGY  Not indicated. ____________________________________________   PROCEDURES  Procedure(s) performed:   Procedures  Critical Care performed: No ____________________________________________   INITIAL IMPRESSION / ASSESSMENT AND PLAN / ED COURSE  GRAYSON Woodard is a 35 y.o. female presents to the ER for dental pain and facial swelling. See HPI. Patient does appear to be in significant pain. She will be treated with antibiotics and short course of Norco until she can see dentist. Information for [place] provided. She is to return to the ER for symptoms that change or worsen if unable to see the dentist.  Pertinent labs & imaging results that were available during my care of the patient were reviewed by me and considered in my medical decision making (see chart for details).  ____________________________________________   FINAL CLINICAL IMPRESSION(S) / ED DIAGNOSES  Final diagnoses:  Dental abscess    Discharge Medication List as of 01/14/2021  5:07 PM     START taking these medications   Details  HYDROcodone-acetaminophen (NORCO/VICODIN) 5-325 MG tablet Take 1 tablet by mouth every 6 (six) hours as needed for up to 3 days for severe pain., Starting Mon 01/14/2021, Until Thu 01/17/2021 at 2359, Normal        If controlled substance prescribed during this visit, 12 month history viewed on the [facility] prior to issuing an initial prescription for Schedule II or III opiod.  Note:  This document was prepared using Dragon voice recognition software and may include unintentional dictation errors.    Chinita Pester, FNP 01/14/21 1854    Jene Every, MD 01/14/21 [phone]

## 2021-01-15 ENCOUNTER — Other Ambulatory Visit: Payer: Self-pay

## 2021-01-15 ENCOUNTER — Emergency Department: Payer: Medicaid Other

## 2021-01-15 ENCOUNTER — Inpatient Hospital Stay
Admission: EM | Admit: 2021-01-15 | Discharge: 2021-01-16 | DRG: 603 | Disposition: A | Discharge status: Home / Self Care | Payer: Medicaid Other | Attending: Hospitalist | Admitting: Hospitalist

## 2021-01-15 ENCOUNTER — Encounter: Payer: Self-pay | Admitting: Emergency Medicine

## 2021-01-15 DIAGNOSIS — L03211 Cellulitis of face: Secondary | ICD-10-CM | POA: Diagnosis present

## 2021-01-15 DIAGNOSIS — Z20822 Contact with and (suspected) exposure to covid-19: Secondary | ICD-10-CM | POA: Diagnosis present

## 2021-01-15 DIAGNOSIS — Z833 Family history of diabetes mellitus: Secondary | ICD-10-CM | POA: Diagnosis not present

## 2021-01-15 DIAGNOSIS — F17200 Nicotine dependence, unspecified, uncomplicated: Secondary | ICD-10-CM | POA: Diagnosis present

## 2021-01-15 DIAGNOSIS — F17210 Nicotine dependence, cigarettes, uncomplicated: Secondary | ICD-10-CM | POA: Diagnosis present

## 2021-01-15 DIAGNOSIS — K029 Dental caries, unspecified: Secondary | ICD-10-CM | POA: Diagnosis present

## 2021-01-15 DIAGNOSIS — K047 Periapical abscess without sinus: Secondary | ICD-10-CM

## 2021-01-15 DIAGNOSIS — I10 Essential (primary) hypertension: Secondary | ICD-10-CM | POA: Diagnosis present

## 2021-01-15 DIAGNOSIS — Z803 Family history of malignant neoplasm of breast: Secondary | ICD-10-CM

## 2021-01-15 LAB — CBC WITH DIFFERENTIAL/PLATELET
Abs Immature Granulocytes: 0.12 10*3/uL — ABNORMAL HIGH (ref 0.00–0.07)
Basophils Absolute: 0.1 10*3/uL (ref 0.0–0.1)
Basophils Relative: 1 %
Eosinophils Absolute: 0.2 10*3/uL (ref 0.0–0.5)
Eosinophils Relative: 1 %
HCT: 38.6 % (ref 36.0–46.0)
Hemoglobin: 13.1 g/dL (ref 12.0–15.0)
Immature Granulocytes: 1 %
Lymphocytes Relative: 20 %
Lymphs Abs: 4.1 10*3/uL — ABNORMAL HIGH (ref 0.7–4.0)
MCH: 30.8 pg (ref 26.0–34.0)
MCHC: 33.9 g/dL (ref 30.0–36.0)
MCV: 90.6 fL (ref 80.0–100.0)
Monocytes Absolute: 0.9 10*3/uL (ref 0.1–1.0)
Monocytes Relative: 4 %
Neutro Abs: 15.6 10*3/uL — ABNORMAL HIGH (ref 1.7–7.7)
Neutrophils Relative %: 73 %
Platelets: 432 10*3/uL — ABNORMAL HIGH (ref 150–400)
RBC: 4.26 MIL/uL (ref 3.87–5.11)
RDW: 13.5 % (ref 11.5–15.5)
WBC: 20.9 10*3/uL — ABNORMAL HIGH (ref 4.0–10.5)
nRBC: 0 % (ref 0.0–0.2)

## 2021-01-15 LAB — RESP PANEL BY RT-PCR (FLU A&B, COVID) ARPGX2
Influenza A by PCR: NEGATIVE
Influenza B by PCR: NEGATIVE
SARS Coronavirus 2 by RT PCR: NEGATIVE

## 2021-01-15 LAB — HIV ANTIBODY (ROUTINE TESTING W REFLEX): HIV Screen 4th Generation wRfx: NONREACTIVE

## 2021-01-15 LAB — BASIC METABOLIC PANEL
Anion gap: 8 (ref 5–15)
BUN: 6 mg/dL (ref 6–20)
CO2: 29 mmol/L (ref 22–32)
Calcium: 8.7 mg/dL — ABNORMAL LOW (ref 8.9–10.3)
Chloride: 102 mmol/L (ref 98–111)
Creatinine, Ser: 0.88 mg/dL (ref 0.44–1.00)
GFR, Estimated: >60 mL/min (ref >60)
Glucose, Bld: 126 mg/dL — ABNORMAL HIGH (ref 70–99)
Potassium: 3.7 mmol/L (ref 3.5–5.1)
Sodium: 139 mmol/L (ref 135–145)

## 2021-01-15 LAB — LACTIC ACID, PLASMA
Lactic Acid, Venous: 1.1 mmol/L (ref 0.5–1.9)
Lactic Acid, Venous: 1.7 mmol/L (ref 0.5–1.9)

## 2021-01-15 MED ORDER — SODIUM CHLORIDE 0.9 % IV SOLN
3.0000 g | Freq: Once | INTRAVENOUS | Status: AC
  Administered 2021-01-15: 3 g via INTRAVENOUS
  Filled 2021-01-15: qty 8

## 2021-01-15 MED ORDER — ENOXAPARIN SODIUM 40 MG/0.4ML IJ SOSY
40.0000 mg | PREFILLED_SYRINGE | INTRAMUSCULAR | Status: DC
  Administered 2021-01-15 – 2021-01-16 (×2): 40 mg via SUBCUTANEOUS
  Filled 2021-01-15 (×2): qty 0.4

## 2021-01-15 MED ORDER — MORPHINE SULFATE (PF) 4 MG/ML IV SOLN
4.0000 mg | Freq: Once | INTRAVENOUS | Status: AC
  Administered 2021-01-15: 4 mg via INTRAVENOUS
  Filled 2021-01-15: qty 1

## 2021-01-15 MED ORDER — KETOROLAC TROMETHAMINE 15 MG/ML IJ SOLN
15.0000 mg | Freq: Once | INTRAMUSCULAR | Status: AC
  Administered 2021-01-15: 15 mg via INTRAVENOUS
  Filled 2021-01-15: qty 1

## 2021-01-15 MED ORDER — SODIUM CHLORIDE 0.45 % IV SOLN: INTRAVENOUS | Status: DC

## 2021-01-15 MED ORDER — ONDANSETRON HCL 4 MG/2ML IJ SOLN: 4.0000 mg | Freq: Four times a day (QID) | INTRAMUSCULAR | Status: DC | PRN

## 2021-01-15 MED ORDER — ONDANSETRON HCL 4 MG PO TABS: 4.0000 mg | ORAL_TABLET | Freq: Four times a day (QID) | ORAL | Status: DC | PRN

## 2021-01-15 MED ORDER — SODIUM CHLORIDE 0.9 % IV SOLN
3.0000 g | Freq: Four times a day (QID) | INTRAVENOUS | Status: DC
  Administered 2021-01-15 – 2021-01-16 (×5): 3 g via INTRAVENOUS
  Filled 2021-01-15: qty 3
  Filled 2021-01-15 (×3): qty 8
  Filled 2021-01-15 (×2): qty 3
  Filled 2021-01-15 (×2): qty 8
  Filled 2021-01-15: qty 3

## 2021-01-15 MED ORDER — PANTOPRAZOLE SODIUM 40 MG IV SOLR
40.0000 mg | INTRAVENOUS | Status: DC
  Administered 2021-01-15 – 2021-01-16 (×2): 40 mg via INTRAVENOUS
  Filled 2021-01-15 (×2): qty 40

## 2021-01-15 MED ORDER — DEXAMETHASONE SODIUM PHOSPHATE 4 MG/ML IJ SOLN
4.0000 mg | Freq: Four times a day (QID) | INTRAMUSCULAR | Status: AC
  Administered 2021-01-15 – 2021-01-16 (×4): 4 mg via INTRAVENOUS
  Filled 2021-01-15 (×4): qty 1

## 2021-01-15 MED ORDER — MORPHINE SULFATE (PF) 2 MG/ML IV SOLN
2.0000 mg | INTRAVENOUS | Status: DC | PRN
  Administered 2021-01-15 – 2021-01-16 (×4): 2 mg via INTRAVENOUS
  Filled 2021-01-15 (×4): qty 1

## 2021-01-15 MED ORDER — IOHEXOL 300 MG/ML  SOLN
75.0000 mL | Freq: Once | INTRAMUSCULAR | Status: AC | PRN
  Administered 2021-01-15: 75 mL via INTRAVENOUS

## 2021-01-15 NOTE — ED Triage Notes (Signed)
Patient ambulatory to triage with steady gait, without difficulty or distress noted, arrived by EMS; seen yesterday for dental pain and rx amoxicillin; returns for increased swelling & pain

## 2021-01-15 NOTE — ED Notes (Signed)
Transport requested for patient for transport to inpatient.

## 2021-01-15 NOTE — ED Notes (Signed)
Pt asking for pain meds, RN is already aware.

## 2021-01-15 NOTE — ED Notes (Signed)
Family called to talk to patient. Patient provided portable phone and number to call [facility].

## 2021-01-15 NOTE — ED Provider Notes (Signed)
[facility] Emergency Department Provider Note   ____________________________________________   Event Date/Time   First MD Initiated Contact with Patient 01/15/21 [phone]     (approximate)  I have reviewed the triage vital signs and the nursing notes.   HISTORY  Chief Complaint Dental Pain    HPI Gabriela Woodard is a 34 y.o. female brought to the ED from home by EMS with a chief complaint of dental pain and facial swelling.  Patient was seen in the ED yesterday for dental abscess and placed on amoxicillin.  States swelling is significantly worse tonight.  Denies fever, cough, chest pain, shortness of breath, abdominal pain, nausea, vomiting or dizziness.     Past Medical History:  Diagnosis Date   Seizures (HCC)    1 seizure at age 2    Patient Active Problem List   Diagnosis Date Noted   Delivery by elective cesarean section 03/13/2017   Smoking (tobacco) complicating childbirth 03/13/2017   Insufficient prenatal care 03/13/2017   Supervision of high risk pregnancy in third trimester 03/13/2017   H/O gestational diabetes in prior pregnancy, currently pregnant 11/29/2012   Rh negative state in antepartum period 10/26/2012   Previous cesarean section 10/25/2012    Past Surgical History:  Procedure Laterality Date   CESAREAN SECTION     CESAREAN SECTION WITH BILATERAL TUBAL LIGATION Bilateral 03/13/2017   Procedure: CESAREAN SECTION WITH BILATERAL TUBAL LIGATION;  Surgeon: Ward, Elenora Fender, MD;  Location: [facility] ORS;  Service: Obstetrics;  Laterality: Bilateral;    Prior to Admission medications   Medication Sig Start Date End Date Taking? Authorizing Provider  amoxicillin (AMOXIL) 875 MG tablet Take 1 tablet (875 mg total) by mouth 2 (two) times daily. 01/14/21   Triplett, Rulon Eisenmenger B, FNP  HYDROcodone-acetaminophen (NORCO/VICODIN) 5-325 MG tablet Take 1 tablet by mouth every 6 (six) hours as needed for up to 3 days for severe pain. 01/14/21 01/17/21   Triplett, Rulon Eisenmenger B, FNP  ibuprofen (ADVIL) 600 MG tablet Take 1 tablet (600 mg total) by mouth every 6 (six) hours as needed. 10/09/20   Tommi Rumps, PA-C    Allergies Patient has no known allergies.  Family History  Problem Relation Age of Onset   Cancer Maternal Grandmother        breast   Cancer Other        breast; maternal grandma's sister.   Diabetes Mellitus II Paternal Grandmother     Social History Social History   Tobacco Use   Smoking status: Every Day    Packs/day: 1.00    Years: 15.00    Pack years: 15.00    Types: Cigarettes   Smokeless tobacco: Never  Vaping Use   Vaping Use: Never used  Substance Use Topics   Alcohol use: No   Drug use: No    Review of Systems  Constitutional: No fever/chills Eyes: No visual changes. ENT: Positive for facial swelling.  No sore throat. Cardiovascular: Denies chest pain. Respiratory: Denies shortness of breath. Gastrointestinal: No abdominal pain.  No nausea, no vomiting.  No diarrhea.  No constipation. Genitourinary: Negative for dysuria. Musculoskeletal: Negative for back pain. Skin: Negative for rash. Neurological: Negative for headaches, focal weakness or numbness.   ____________________________________________   PHYSICAL EXAM:  VITAL SIGNS: ED Triage Vitals [01/15/21 0341]  Enc Vitals Group     BP (!) 162/103     Pulse Rate 72     Resp 18     Temp 98 F (36.7 C)  Temp Source Oral     SpO2 99 %     Weight 125 lb (56.7 kg)     Height 5\' 1"  (1.549 m)     Head Circumference      Peak Flow      Pain Score 10     Pain Loc      Pain Edu?      Excl. in GC?     Constitutional: Alert and oriented. Well appearing and in mild acute distress. Eyes: Conjunctivae are normal. PERRL. EOMI. Head: Atraumatic. Nose: No congestion/rhinnorhea. Mouth/Throat: Mucous membranes are moist.  Moderate right jaw and facial swelling.  Widespread dental decay.  No trismus.  Tolerating secretions well.  Neck: No  stridor.  No swelling or palpable mass.  Supple neck without meningismus. Cardiovascular: Normal rate, regular rhythm. Grossly normal heart sounds.  Good peripheral circulation. Respiratory: Normal respiratory effort.  No retractions. Lungs CTAB. Gastrointestinal: Soft and nontender. No distention. No abdominal bruits. No CVA tenderness. Musculoskeletal: No lower extremity tenderness nor edema.  No joint effusions. Neurologic:  Normal speech and language. No gross focal neurologic deficits are appreciated. No gait instability. Skin:  Skin is warm, dry and intact. No rash noted. Psychiatric: Mood and affect are normal. Speech and behavior are normal.  ____________________________________________   LABS (all labs ordered are listed, but only abnormal results are displayed)  Labs Reviewed  CBC WITH DIFFERENTIAL/PLATELET - Abnormal; Notable for the following components:      Result Value   WBC 20.9 (*)    Platelets 432 (*)    Neutro Abs 15.6 (*)    Lymphs Abs 4.1 (*)    Abs Immature Granulocytes 0.12 (*)    All other components within normal limits  BASIC METABOLIC PANEL - Abnormal; Notable for the following components:   Glucose, Bld 126 (*)    Calcium 8.7 (*)    All other components within normal limits  CULTURE, BLOOD (ROUTINE X 2)  CULTURE, BLOOD (ROUTINE X 2)  RESP PANEL BY RT-PCR (FLU A&B, COVID) ARPGX2  LACTIC ACID, PLASMA   ____________________________________________  EKG  None ____________________________________________  RADIOLOGY I, Kilian Schwartz J, personally viewed and evaluated these images (plain radiographs) as part of my medical decision making, as well as reviewing the written report by the radiologist.  ED MD interpretation: Moderate to severe right facial cellulitis without drainable abscess  Official radiology report(s): CT Maxillofacial W Contrast  Result Date: 01/15/2021 CLINICAL DATA:  35 year old female with dental pain and started on amoxicillin  yesterday. But increased facial pain and swelling now. EXAM: CT MAXILLOFACIAL WITH CONTRAST TECHNIQUE: Multidetector CT imaging of the maxillofacial structures was performed with intravenous contrast. Multiplanar CT image reconstructions were also generated. CONTRAST:  57mL OMNIPAQUE IOHEXOL 300 MG/ML  SOLN COMPARISON:  Head CT 12/16/2018. FINDINGS: Osseous: Mandible intact and normally located. No acute fracture of the maxilla, zygoma, nasal bones, pterygoid plates, or central skull base. Visible cervical vertebrae and calvarium appear intact. Widespread carious dentition. Multifocal bilateral dental periapical lucency. On the right side periapical lucency of the mandible posterior bicuspid is in proximity to the right mental foramen. And also near that level there is some lateral dehiscence of the right maxilla alveolus related to carious right maxillary canine and bicuspids. Orbits: Intact orbital walls. Globes and other orbits soft tissues appears symmetric and within normal limits. Sinuses: Paranasal sinuses, tympanic cavities and mastoids are well aerated. Soft tissues: Negative visible larynx, pharynx, parapharyngeal spaces, retropharyngeal space, sublingual space, left-side submandibular, masticator and parotid spaces.  Moderate to severe anterior and lateral right face soft tissue swelling and stranding with epicenter at the buccal space and body of the mandible. No soft tissue gas. No organized or drainable fluid collection identified at this time. Secondary inflammation of the right masticator space. The right submandibular space is more mildly affected, with confluent subcutaneous edema or thickening along the right platysma. Right submandibular and parotid glands remain within normal limits. Reactive appearing right level 1 and level 2 lymph nodes. No cystic or necrotic nodes. Limited intracranial: The major vascular structures in the visible face and at the skull base are enhancing and appear patent.  Tortuous cervical right ICA. Negative visible brain parenchyma. IMPRESSION: Moderate to severe right facial cellulitis which may be odontogenic, with underlying poor dentition and carious right maxillary and mandible bicuspids in proximity to the inflammation. There is associated dental periapical lucency and dehiscence, but no subperiosteal or soft tissue abscess at this time. Reactive right level 1 and level 2 lymph nodes. Electronically Signed   By: Odessa Fleming M.D.   On: 01/15/2021 05:26    ____________________________________________   PROCEDURES  Procedure(s) performed (including Critical Care):  Procedures   ____________________________________________   INITIAL IMPRESSION / ASSESSMENT AND PLAN / ED COURSE  As part of my medical decision making, I reviewed the following data within the electronic MEDICAL RECORD NUMBER Nursing notes reviewed and incorporated, Labs reviewed, Old chart reviewed, Discussed with admitting physician, and Notes from prior ED visits     35 year old female presenting with right facial swelling and pain.  Differential diagnosis includes but is not limited to facial cellulitis, facial abscess, Ludwig's angina, etc.  Laboratory results demonstrate leukocytosis.  CT maxillofacial demonstrates moderate to severe facial cellulitis without drainable abscess.  Lactic acid unremarkable.  Will obtain blood cultures, initiate treatment with IV Unasyn.  Administer IV morphine for pain.  Will discuss with hospitalist services for admission.      ____________________________________________   FINAL CLINICAL IMPRESSION(S) / ED DIAGNOSES  Final diagnoses:  Facial cellulitis  Dental infection  Pain due to dental caries     ED Discharge Orders     None        Note:  This document was prepared using Dragon voice recognition software and may include unintentional dictation errors.    Irean Hong, MD 01/15/21 [phone]

## 2021-01-15 NOTE — H&P (Signed)
History and Physical    Gabriela Woodard VQQ:[id number] DOB: 1985-11-29 DOA: 01/15/2021  PCP: Patient, No Pcp Per (Inactive)   Patient coming from: Home  I have personally briefly reviewed patient's old medical records in [facility]  Chief Complaint: Right-sided facial swelling  HPI: Gabriela Woodard is a 35 y.o. female with medical history significant for seizure disorder, nicotine dependence who presents to the emergency room for evaluation of swelling involving the right lower jaw and extending to the right side of her face.  She has had symptoms for about 2 days.  Swelling is associated with pain which she rated 10 x 10 in intensity at its worst.  Patient notes that she has bad teeth that are supposed to be pulled but has not been able to get an appointment with a dentist.  She has had no relief with over-the-counter medications and dental exams. She complains of difficulty swallowing but is able to control her secretions. She denies having any fever or chills, no cough, no chest pain, no dizziness, no lightheadedness, no headache, no nausea, no vomiting, no abdominal pain, no changes in her bowel habits, no urinary symptoms, no blurred vision or focal deficit. Labs show sodium 139, potassium 3.7, chloride 102, bicarb 29, glucose 126, BUN 6, calcium 8.7, lactic acid 1.1, white count 20.9, hemoglobin 13.1, hematocrit 38.6, MCV 90.6, RDW 13.5, platelet count 432 Respiratory viral panel is negative Maxillofacial CT shows moderate to severe right facial cellulitis which may be odontogenic, with underlying poor dentition and carious right maxillary and mandible bicuspids in proximity to the inflammation. There is associated dental periapical lucency and dehiscence, but no subperiosteal or soft tissue abscess at this time. Reactive right level 1 and level 2 lymph nodes.    ED Course: Patient is a 35 year old female who presents to the ER for evaluation of a 2-day history of right-sided facial  swelling associated with pain. She was initially seen in the ER for dental abscess and was discharged home on Vicodin and amoxicillin. She returned back to the ER within 24 hours due to worsening pain and swelling. Labs show marked leukocytosis but with a normal lactic acid level. She will be admitted to the hospital for further evaluation.   Review of Systems: As per HPI otherwise all other systems reviewed and negative.    Past Medical History:  Diagnosis Date   Seizures (HCC)    1 seizure at age 31    Past Surgical History:  Procedure Laterality Date   CESAREAN SECTION     CESAREAN SECTION WITH BILATERAL TUBAL LIGATION Bilateral 03/13/2017   Procedure: CESAREAN SECTION WITH BILATERAL TUBAL LIGATION;  Surgeon: Ward, Elenora Fender, MD;  Location: [facility] ORS;  Service: Obstetrics;  Laterality: Bilateral;     reports that she has been smoking cigarettes. She has a 15.00 pack-year smoking history. She has never used smokeless tobacco. She reports that she does not drink alcohol and does not use drugs.  No Known Allergies  Family History  Problem Relation Age of Onset   Cancer Maternal Grandmother        breast   Cancer Other        breast; maternal grandma's sister.   Diabetes Mellitus II Paternal Grandmother       Prior to Admission medications   Medication Sig Start Date End Date Taking? Authorizing Provider  amoxicillin (AMOXIL) 875 MG tablet Take 1 tablet (875 mg total) by mouth 2 (two) times daily. 01/14/21  Yes Triplett, Cari B,  FNP  HYDROcodone-acetaminophen (NORCO/VICODIN) 5-325 MG tablet Take 1 tablet by mouth every 6 (six) hours as needed for up to 3 days for severe pain. 01/14/21 01/17/21 Yes Triplett, Cari B, FNP  ibuprofen (ADVIL) 600 MG tablet Take 1 tablet (600 mg total) by mouth every 6 (six) hours as needed. Patient not taking: Reported on 01/15/2021 10/09/20   Tommi RumpsSummers, Rhonda L, PA-C    Physical Exam: Vitals:   01/15/21 0341 01/15/21 0632  BP: (!) 162/103 (!)  151/93  Pulse: 72   Resp: 18 18  Temp: 98 F (36.7 C)   TempSrc: Oral   SpO2: 99% 99%  Weight: 56.7 kg   Height: 5\' 1"  (1.549 m)      Vitals:   01/15/21 0341 01/15/21 0632  BP: (!) 162/103 (!) 151/93  Pulse: 72   Resp: 18 18  Temp: 98 F (36.7 C)   TempSrc: Oral   SpO2: 99% 99%  Weight: 56.7 kg   Height: 5\' 1"  (1.549 m)       Constitutional: Alert and oriented x 3 .  Appears to be in painful distress.  Swelling involving the right side of her face extending from the jaw to the upper part of her face. HEENT:      Head: Normocephalic and atraumatic.         Eyes: PERLA, EOMI, Conjunctivae are normal. Sclera is non-icteric.       Mouth/Throat: Swelling involving the right jaw and face.  Widespread dental decay.      Neck: Supple with no signs of meningismus. Cardiovascular: Regular rate and rhythm. No murmurs, gallops, or rubs. 2+ symmetrical distal pulses are present . No JVD. No LE edema Respiratory: Respiratory effort normal .Lungs sounds clear bilaterally. No wheezes, crackles, or rhonchi.  Gastrointestinal: Soft, non tender, and non distended with positive bowel sounds.  Genitourinary: No CVA tenderness. Musculoskeletal: Nontender with normal range of motion in all extremities. No cyanosis, or erythema of extremities. Neurologic: Right-sided facial swelling. Moving all extremities. No gross focal neurologic deficits . Skin: Skin is warm, dry.  No rash or ulcers Psychiatric: Very tearful and in pain   Labs on Admission: I have personally reviewed following labs and imaging studies  CBC: Recent Labs  Lab 01/15/21 0351  WBC 20.9*  NEUTROABS 15.6*  HGB 13.1  HCT 38.6  MCV 90.6  PLT 432*   Basic Metabolic Panel: Recent Labs  Lab 01/15/21 0351  NA 139  K 3.7  CL 102  CO2 29  GLUCOSE 126*  BUN 6  CREATININE 0.88  CALCIUM 8.7*   GFR: Estimated Creatinine Clearance: 68.6 mL/min (by C-G formula based on SCr of 0.88 mg/dL). Liver Function Tests: No  results for input(s): AST, ALT, ALKPHOS, BILITOT, PROT, ALBUMIN in the last 168 hours. No results for input(s): LIPASE, AMYLASE in the last 168 hours. No results for input(s): AMMONIA in the last 168 hours. Coagulation Profile: No results for input(s): INR, PROTIME in the last 168 hours. Cardiac Enzymes: No results for input(s): CKTOTAL, CKMB, CKMBINDEX, TROPONINI in the last 168 hours. BNP (last 3 results) No results for input(s): PROBNP in the last 8760 hours. HbA1C: No results for input(s): HGBA1C in the last 72 hours. CBG: No results for input(s): GLUCAP in the last 168 hours. Lipid Profile: No results for input(s): CHOL, HDL, LDLCALC, TRIG, CHOLHDL, LDLDIRECT in the last 72 hours. Thyroid Function Tests: No results for input(s): TSH, T4TOTAL, FREET4, T3FREE, THYROIDAB in the last 72 hours. Anemia Panel: No results for  input(s): VITAMINB12, FOLATE, FERRITIN, TIBC, IRON, RETICCTPCT in the last 72 hours. Urine analysis:    Component Value Date/Time   COLORURINE YELLOW (A) 12/16/2018 1905   APPEARANCEUR HAZY (A) 12/16/2018 1905   LABSPEC 1.012 12/16/2018 1905   PHURINE 6.0 12/16/2018 1905   GLUCOSEU NEGATIVE 12/16/2018 1905   HGBUR NEGATIVE 12/16/2018 1905   BILIRUBINUR NEGATIVE 12/16/2018 1905   KETONESUR NEGATIVE 12/16/2018 1905   PROTEINUR NEGATIVE 12/16/2018 1905   UROBILINOGEN 0.2 10/25/2012 1450   NITRITE POSITIVE (A) 12/16/2018 1905   LEUKOCYTESUR TRACE (A) 12/16/2018 1905    Radiological Exams on Admission: CT Maxillofacial W Contrast  Result Date: 01/15/2021 CLINICAL DATA:  35 year old female with dental pain and started on amoxicillin yesterday. But increased facial pain and swelling now. EXAM: CT MAXILLOFACIAL WITH CONTRAST TECHNIQUE: Multidetector CT imaging of the maxillofacial structures was performed with intravenous contrast. Multiplanar CT image reconstructions were also generated. CONTRAST:  63mL OMNIPAQUE IOHEXOL 300 MG/ML  SOLN COMPARISON:  Head CT  12/16/2018. FINDINGS: Osseous: Mandible intact and normally located. No acute fracture of the maxilla, zygoma, nasal bones, pterygoid plates, or central skull base. Visible cervical vertebrae and calvarium appear intact. Widespread carious dentition. Multifocal bilateral dental periapical lucency. On the right side periapical lucency of the mandible posterior bicuspid is in proximity to the right mental foramen. And also near that level there is some lateral dehiscence of the right maxilla alveolus related to carious right maxillary canine and bicuspids. Orbits: Intact orbital walls. Globes and other orbits soft tissues appears symmetric and within normal limits. Sinuses: Paranasal sinuses, tympanic cavities and mastoids are well aerated. Soft tissues: Negative visible larynx, pharynx, parapharyngeal spaces, retropharyngeal space, sublingual space, left-side submandibular, masticator and parotid spaces. Moderate to severe anterior and lateral right face soft tissue swelling and stranding with epicenter at the buccal space and body of the mandible. No soft tissue gas. No organized or drainable fluid collection identified at this time. Secondary inflammation of the right masticator space. The right submandibular space is more mildly affected, with confluent subcutaneous edema or thickening along the right platysma. Right submandibular and parotid glands remain within normal limits. Reactive appearing right level 1 and level 2 lymph nodes. No cystic or necrotic nodes. Limited intracranial: The major vascular structures in the visible face and at the skull base are enhancing and appear patent. Tortuous cervical right ICA. Negative visible brain parenchyma. IMPRESSION: Moderate to severe right facial cellulitis which may be odontogenic, with underlying poor dentition and carious right maxillary and mandible bicuspids in proximity to the inflammation. There is associated dental periapical lucency and dehiscence, but no  subperiosteal or soft tissue abscess at this time. Reactive right level 1 and level 2 lymph nodes. Electronically Signed   By: Odessa Fleming M.D.   On: 01/15/2021 05:26     Assessment/Plan Principal Problem:   Facial cellulitis Active Problems:   Essential hypertension   Nicotine dependence      Facial cellulitis Patient presents for evaluation of a 2-day history of right-sided facial pain and swelling which appears to be odontogenic. Will continue IV Unasyn initiated in the ER Pain control with IV morphine  Will administer IV Decadron for swelling Patient is to follow-up with dental surgery upon discharge.    Nicotine dependence Smoking cessation has been discussed with patient in detail She declines a nicotine transdermal patch    Hypertension Blood pressure is uncontrolled secondary to pain Will place patient on as needed IV hydralazine for systolic blood pressure greater than  DVT prophylaxis: Lovenox  Code Status: full code  Family Communication: Greater than 50% of time was spent discussing patient's condition and plan of care with her at the bedside.  All questions and concerns have been addressed.  She verbalizes understanding and agrees to the plan. Disposition Plan: Back to previous home environment Consults called: none  Status: At the time of admission, it appears that the appropriate admission status for this patient is inpatient. This is judged to be reasonable and necessary in order to provide the required intensity of service to ensure the patient's safety given the presenting symptoms, physical exam findings, and initial radiographic and laboratory data in the context of their comorbid conditions. Patient requires inpatient status due to high intensity of service, high risk for further deterioration and high frequency of surveillance required.    Lucile Shutters MD [facility]     01/15/2021, 8:44 AM

## 2021-01-16 DIAGNOSIS — L03211 Cellulitis of face: Secondary | ICD-10-CM | POA: Diagnosis not present

## 2021-01-16 LAB — BASIC METABOLIC PANEL
Anion gap: 8 (ref 5–15)
BUN: 7 mg/dL (ref 6–20)
CO2: 28 mmol/L (ref 22–32)
Calcium: 8.9 mg/dL (ref 8.9–10.3)
Chloride: 103 mmol/L (ref 98–111)
Creatinine, Ser: 0.64 mg/dL (ref 0.44–1.00)
GFR, Estimated: >60 mL/min (ref >60)
Glucose, Bld: 152 mg/dL — ABNORMAL HIGH (ref 70–99)
Potassium: 4 mmol/L (ref 3.5–5.1)
Sodium: 139 mmol/L (ref 135–145)

## 2021-01-16 LAB — CBC
HCT: 36.5 % (ref 36.0–46.0)
Hemoglobin: 12.4 g/dL (ref 12.0–15.0)
MCH: 30.8 pg (ref 26.0–34.0)
MCHC: 34 g/dL (ref 30.0–36.0)
MCV: 90.6 fL (ref 80.0–100.0)
Platelets: 379 10*3/uL (ref 150–400)
RBC: 4.03 MIL/uL (ref 3.87–5.11)
RDW: 13.4 % (ref 11.5–15.5)
WBC: 18 10*3/uL — ABNORMAL HIGH (ref 4.0–10.5)
nRBC: 0 % (ref 0.0–0.2)

## 2021-01-16 MED ORDER — IBUPROFEN 400 MG PO TABS: 600.0000 mg | ORAL_TABLET | Freq: Four times a day (QID) | ORAL | Status: DC | PRN

## 2021-01-16 MED ORDER — IBUPROFEN 400 MG PO TABS
600.0000 mg | ORAL_TABLET | Freq: Four times a day (QID) | ORAL | Status: DC
  Filled 2021-01-16: qty 2

## 2021-01-16 MED ORDER — NICOTINE POLACRILEX 2 MG MT GUM
2.0000 mg | CHEWING_GUM | OROMUCOSAL | Status: DC | PRN
  Filled 2021-01-16 (×2): qty 1

## 2021-01-16 MED ORDER — IBUPROFEN 600 MG PO TABS: 600.0000 mg | ORAL_TABLET | Freq: Four times a day (QID) | ORAL | 0 refills | Status: AC | PRN

## 2021-01-16 NOTE — Plan of Care (Signed)
  Problem: Education: Goal: Knowledge of General Education information will improve Description: Including pain rating scale, medication(s)/side effects and non-pharmacologic comfort measures Outcome: Adequate for Discharge   

## 2021-01-16 NOTE — Discharge Summary (Signed)
Physician Discharge Summary   Gabriela Woodard  female DOB: Nov 02, 1985  ZOX:[id number]  PCP: Patient, No Pcp Per (Inactive)  Admit date: 01/15/2021 Discharge date: 01/16/2021  Admitted From: home Disposition:  home CODE STATUS: Full code  Discharge Instructions     Discharge instructions   Complete by: As directed    You have cellulitis from the dental infection.  Symptoms have improved with IV antibiotic, and you want to go home.  Please finish taking the rest of the antibiotic Amoxicillin you had already picked up from your initial ED visit.    Be sure to see a dentist to take care of your dental infection.   Dr. Darlin Priestly [facility] Course:  For full details, please see H&P, progress notes, consult notes and ancillary notes.  Briefly,  Gabriela Woodard is a 35 y.o. female with medical history significant for seizure disorder, nicotine dependence who presented to the emergency room on 7/26 for the 2nd time for evaluation of swelling and pain involving the right lower jaw and extending to the right side of her face.  She has had symptoms for about 3 days.    Patient notes that she has bad teeth that are supposed to be pulled but has not been able to get an appointment with a dentist.  She complains of difficulty swallowing but is able to control her secretions.  Pt originally presented to ED for this on 7/25 and was discharged with amoxicillin and dental followup, however, pt returned due to worsening pain.  Facial cellulitis due to dental caries and infection --started on IV unasyn and IV decadron on presentation, and pt reported much improvement the next day and wanted to be discharged.   --Pt was discharged to finish the 10 days of amoxicillin she had already received from her prior ED visit. --Patient is to follow-up with dental surgery upon discharge, already had one scheduled.   Nicotine dependence Smoking cessation had been discussed with patient in detail She  declined a nicotine transdermal patch, but asked for nicotine gum.   Elevated BP due to pain No hx of HTN.   Discharge Diagnoses:  Principal Problem:   Facial cellulitis Active Problems:   Essential hypertension   Nicotine dependence   30 Day Unplanned Readmission Risk Score    Flowsheet Row ED to Hosp-Admission (Current) from 01/15/2021 in [facility]  30 Day Unplanned Readmission Risk Score (%) 5.46 Filed at 01/16/2021 1200       This score is the patient's risk of an unplanned readmission within 30 days of being discharged (0 -100%). The score is based on dignosis, age, lab data, medications, orders, and past utilization.   Low:  0-14.9   Medium: 15-21.9   High: 22-29.9   Extreme: 30 and above         Discharge Instructions:  Allergies as of 01/16/2021   No Known Allergies      Medication List     STOP taking these medications    HYDROcodone-acetaminophen 5-325 MG tablet Commonly known as: NORCO/VICODIN       TAKE these medications    amoxicillin 875 MG tablet Commonly known as: AMOXIL Take 1 tablet (875 mg total) by mouth 2 (two) times daily.   ibuprofen 600 MG tablet Commonly known as: ADVIL Take 1 tablet (600 mg total) by mouth every 6 (six) hours as needed for up to 5 days.  Follow-up Information     Dentist Follow up.   Why: ASAP                No Known Allergies   The results of significant diagnostics from this hospitalization (including imaging, microbiology, ancillary and laboratory) are listed below for reference.   Consultations:   Procedures/Studies: CT Maxillofacial W Contrast  Result Date: 01/15/2021 CLINICAL DATA:  35 year old female with dental pain and started on amoxicillin yesterday. But increased facial pain and swelling now. EXAM: CT MAXILLOFACIAL WITH CONTRAST TECHNIQUE: Multidetector CT imaging of the maxillofacial structures was performed with intravenous  contrast. Multiplanar CT image reconstructions were also generated. CONTRAST:  75mL OMNIPAQUE IOHEXOL 300 MG/ML  SOLN COMPARISON:  Head CT 12/16/2018. FINDINGS: Osseous: Mandible intact and normally located. No acute fracture of the maxilla, zygoma, nasal bones, pterygoid plates, or central skull base. Visible cervical vertebrae and calvarium appear intact. Widespread carious dentition. Multifocal bilateral dental periapical lucency. On the right side periapical lucency of the mandible posterior bicuspid is in proximity to the right mental foramen. And also near that level there is some lateral dehiscence of the right maxilla alveolus related to carious right maxillary canine and bicuspids. Orbits: Intact orbital walls. Globes and other orbits soft tissues appears symmetric and within normal limits. Sinuses: Paranasal sinuses, tympanic cavities and mastoids are well aerated. Soft tissues: Negative visible larynx, pharynx, parapharyngeal spaces, retropharyngeal space, sublingual space, left-side submandibular, masticator and parotid spaces. Moderate to severe anterior and lateral right face soft tissue swelling and stranding with epicenter at the buccal space and body of the mandible. No soft tissue gas. No organized or drainable fluid collection identified at this time. Secondary inflammation of the right masticator space. The right submandibular space is more mildly affected, with confluent subcutaneous edema or thickening along the right platysma. Right submandibular and parotid glands remain within normal limits. Reactive appearing right level 1 and level 2 lymph nodes. No cystic or necrotic nodes. Limited intracranial: The major vascular structures in the visible face and at the skull base are enhancing and appear patent. Tortuous cervical right ICA. Negative visible brain parenchyma. IMPRESSION: Moderate to severe right facial cellulitis which may be odontogenic, with underlying poor dentition and carious right  maxillary and mandible bicuspids in proximity to the inflammation. There is associated dental periapical lucency and dehiscence, but no subperiosteal or soft tissue abscess at this time. Reactive right level 1 and level 2 lymph nodes. Electronically Signed   By: Odessa FlemingH  Hall M.D.   On: 01/15/2021 05:26      Labs: BNP (last 3 results) No results for input(s): BNP in the last 8760 hours. Basic Metabolic Panel: Recent Labs  Lab 01/15/21 0351 01/16/21 0442  NA 139 139  K 3.7 4.0  CL 102 103  CO2 29 28  GLUCOSE 126* 152*  BUN 6 7  CREATININE 0.88 0.64  CALCIUM 8.7* 8.9   Liver Function Tests: No results for input(s): AST, ALT, ALKPHOS, BILITOT, PROT, ALBUMIN in the last 168 hours. No results for input(s): LIPASE, AMYLASE in the last 168 hours. No results for input(s): AMMONIA in the last 168 hours. CBC: Recent Labs  Lab 01/15/21 0351 01/16/21 0442  WBC 20.9* 18.0*  NEUTROABS 15.6*  --   HGB 13.1 12.4  HCT 38.6 36.5  MCV 90.6 90.6  PLT 432* 379   Cardiac Enzymes: No results for input(s): CKTOTAL, CKMB, CKMBINDEX, TROPONINI in the last 168 hours. BNP: Invalid input(s): POCBNP CBG: No results for input(s): GLUCAP in  the last 168 hours. D-Dimer No results for input(s): DDIMER in the last 72 hours. Hgb A1c No results for input(s): HGBA1C in the last 72 hours. Lipid Profile No results for input(s): CHOL, HDL, LDLCALC, TRIG, CHOLHDL, LDLDIRECT in the last 72 hours. Thyroid function studies No results for input(s): TSH, T4TOTAL, T3FREE, THYROIDAB in the last 72 hours.  Invalid input(s): FREET3 Anemia work up No results for input(s): VITAMINB12, FOLATE, FERRITIN, TIBC, IRON, RETICCTPCT in the last 72 hours. Urinalysis    Component Value Date/Time   COLORURINE YELLOW (A) 12/16/2018 1905   APPEARANCEUR HAZY (A) 12/16/2018 1905   LABSPEC 1.012 12/16/2018 1905   PHURINE 6.0 12/16/2018 1905   GLUCOSEU NEGATIVE 12/16/2018 1905   HGBUR NEGATIVE 12/16/2018 1905   BILIRUBINUR  NEGATIVE 12/16/2018 1905   KETONESUR NEGATIVE 12/16/2018 1905   PROTEINUR NEGATIVE 12/16/2018 1905   UROBILINOGEN 0.2 10/25/2012 1450   NITRITE POSITIVE (A) 12/16/2018 1905   LEUKOCYTESUR TRACE (A) 12/16/2018 1905   Sepsis Labs Invalid input(s): PROCALCITONIN,  WBC,  LACTICIDVEN Microbiology Recent Results (from the past 240 hour(s))  Culture, blood (routine x 2)     Status: None (Preliminary result)   Collection Time: 01/15/21  6:22 AM   Specimen: BLOOD  Result Value Ref Range Status   Specimen Description BLOOD RIGHT HAND  Final   Special Requests   Final    BOTTLES DRAWN AEROBIC AND ANAEROBIC Blood Culture adequate volume   Culture   Final    NO GROWTH 1 DAY Performed at [facility], [address] [place], [place] [address]    Report Status PENDING  Incomplete  Culture, blood (routine x 2)     Status: None (Preliminary result)   Collection Time: 01/15/21  6:22 AM   Specimen: BLOOD  Result Value Ref Range Status   Specimen Description BLOOD RIGHT FA  Final   Special Requests   Final    BOTTLES DRAWN AEROBIC ONLY Blood Culture results may not be optimal due to an inadequate volume of blood received in culture bottles   Culture   Final    NO GROWTH 1 DAY Performed at [facility], [address] [place], [place] [address]    Report Status PENDING  Incomplete  Resp Panel by RT-PCR (Flu A&B, Covid) Nasopharyngeal Swab     Status: None   Collection Time: 01/15/21  6:22 AM   Specimen: Nasopharyngeal Swab; Nasopharyngeal(NP) swabs in vial transport medium  Result Value Ref Range Status   SARS Coronavirus 2 by RT PCR NEGATIVE NEGATIVE Final    Comment: (NOTE) SARS-CoV-2 target nucleic acids are NOT DETECTED.  The SARS-CoV-2 RNA is generally detectable in upper respiratory specimens during the acute phase of infection. The lowest concentration of SARS-CoV-2 viral copies this assay can detect is 138 copies/mL. A negative result does not preclude  SARS-Cov-2 infection and should not be used as the sole basis for treatment or other patient management decisions. A negative result may occur with  improper specimen collection/handling, submission of specimen other than nasopharyngeal swab, presence of viral mutation(s) within the areas targeted by this assay, and inadequate number of viral copies(<138 copies/mL). A negative result must be combined with clinical observations, patient history, and epidemiological information. The expected result is Negative.  Fact Sheet for Patients:  BloggerCourse.com  Fact Sheet for Healthcare Providers:  SeriousBroker.it  This test is no t yet approved or cleared by the [place] FDA and  has been authorized for detection and/or diagnosis of SARS-CoV-2 by  FDA under an Emergency Use Authorization (EUA). This EUA will remain  in effect (meaning this test can be used) for the duration of the COVID-19 declaration under Section 564(b)(1) of the Act, 21 U.S.C.section 360bbb-3(b)(1), unless the authorization is terminated  or revoked sooner.       Influenza A by PCR NEGATIVE NEGATIVE Final   Influenza B by PCR NEGATIVE NEGATIVE Final    Comment: (NOTE) The Xpert Xpress SARS-CoV-2/FLU/RSV plus assay is intended as an aid in the diagnosis of influenza from Nasopharyngeal swab specimens and should not be used as a sole basis for treatment. Nasal washings and aspirates are unacceptable for Xpert Xpress SARS-CoV-2/FLU/RSV testing.  Fact Sheet for Patients: BloggerCourse.com  Fact Sheet for Healthcare Providers: SeriousBroker.it  This test is not yet approved or cleared by the [place] FDA and has been authorized for detection and/or diagnosis of SARS-CoV-2 by FDA under an Emergency Use Authorization (EUA). This EUA will remain in effect (meaning this test can be used) for the duration of  the COVID-19 declaration under Section 564(b)(1) of the Act, 21 U.S.C. section 360bbb-3(b)(1), unless the authorization is terminated or revoked.  Performed at [facility], [address] [place], [place] [address]      Total time spend on discharging this patient, including the last patient exam, discussing the hospital stay, instructions for ongoing care as it relates to all pertinent caregivers, as well as preparing the medical discharge records, prescriptions, and/or referrals as applicable, is 50 minutes.    Darlin Priestly, MD  [facility] 01/16/2021, 2:02 PM

## 2021-01-22 LAB — CULTURE, BLOOD (ROUTINE X 2)
Culture: NO GROWTH
Culture: NO GROWTH
Special Requests: ADEQUATE

## 2021-01-26 ENCOUNTER — Emergency Department
Admission: EM | Admit: 2021-01-26 | Discharge: 2021-01-26 | Disposition: A | Discharge status: Home / Self Care | Payer: Medicaid Other | Attending: Student in an Organized Health Care Education/Training Program | Admitting: Student in an Organized Health Care Education/Training Program

## 2021-01-26 ENCOUNTER — Other Ambulatory Visit: Payer: Self-pay

## 2021-01-26 DIAGNOSIS — F17210 Nicotine dependence, cigarettes, uncomplicated: Secondary | ICD-10-CM | POA: Diagnosis not present

## 2021-01-26 DIAGNOSIS — T401X1A Poisoning by heroin, accidental (unintentional), initial encounter: Secondary | ICD-10-CM | POA: Diagnosis present

## 2021-01-26 DIAGNOSIS — I10 Essential (primary) hypertension: Secondary | ICD-10-CM | POA: Insufficient documentation

## 2021-01-26 LAB — CBC
HCT: 41.4 % (ref 36.0–46.0)
Hemoglobin: 13.7 g/dL (ref 12.0–15.0)
MCH: 31.4 pg (ref 26.0–34.0)
MCHC: 33.1 g/dL (ref 30.0–36.0)
MCV: 95 fL (ref 80.0–100.0)
Platelets: 466 10*3/uL — ABNORMAL HIGH (ref 150–400)
RBC: 4.36 MIL/uL (ref 3.87–5.11)
RDW: 14.1 % (ref 11.5–15.5)
WBC: 26.5 10*3/uL — ABNORMAL HIGH (ref 4.0–10.5)
nRBC: 0 % (ref 0.0–0.2)

## 2021-01-26 LAB — BASIC METABOLIC PANEL
Anion gap: 9 (ref 5–15)
BUN: 26 mg/dL — ABNORMAL HIGH (ref 6–20)
CO2: 22 mmol/L (ref 22–32)
Calcium: 8 mg/dL — ABNORMAL LOW (ref 8.9–10.3)
Chloride: 107 mmol/L (ref 98–111)
Creatinine, Ser: 1.67 mg/dL — ABNORMAL HIGH (ref 0.44–1.00)
GFR, Estimated: 41 mL/min — ABNORMAL LOW (ref >60)
Glucose, Bld: 122 mg/dL — ABNORMAL HIGH (ref 70–99)
Potassium: 4.1 mmol/L (ref 3.5–5.1)
Sodium: 138 mmol/L (ref 135–145)

## 2021-01-26 LAB — ACETAMINOPHEN LEVEL: Acetaminophen (Tylenol), Serum: <10 ug/mL — ABNORMAL LOW (ref 10–30)

## 2021-01-26 LAB — HCG, QUANTITATIVE, PREGNANCY: hCG, Beta Chain, Quant, S: <1 m[IU]/mL (ref <5)

## 2021-01-26 MED ORDER — METOCLOPRAMIDE HCL 5 MG/ML IJ SOLN
10.0000 mg | Freq: Once | INTRAMUSCULAR | Status: AC
  Administered 2021-01-26: 10 mg via INTRAVENOUS
  Filled 2021-01-26: qty 2

## 2021-01-26 MED ORDER — ONDANSETRON 4 MG PO TBDP
4.0000 mg | ORAL_TABLET | Freq: Once | ORAL | Status: AC
  Administered 2021-01-26: 4 mg via ORAL
  Filled 2021-01-26: qty 1

## 2021-01-26 MED ORDER — ACETAMINOPHEN 325 MG PO TABS: 650.0000 mg | ORAL_TABLET | Freq: Once | ORAL | Status: DC

## 2021-01-26 MED ORDER — SODIUM CHLORIDE 0.9 % IV BOLUS
1000.0000 mL | Freq: Once | INTRAVENOUS | Status: AC
  Administered 2021-01-26: 1000 mL via INTRAVENOUS

## 2021-01-26 NOTE — ED Notes (Signed)
Pt given water. Pt responsive to voice.

## 2021-01-26 NOTE — ED Triage Notes (Signed)
Pt comes ems found unresponsive. 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 Pt was given 6mg  narcan IN. Tachy but other vitals stable after narcan.

## 2021-01-26 NOTE — ED Provider Notes (Signed)
[facility] Emergency Department Provider Note    Event Date/Time   First MD Initiated Contact with Patient 01/26/21 1639     (approximate)  I have reviewed the triage vital signs and the nursing notes.   HISTORY  Chief Complaint Drug Overdose  Level V Caveat:  Overdose  HPI Gabriela Woodard is a 35 y.o. female with below listed past medical history with known polysubstance abuse presents to the ER after EMS was called respond to an overdose.  Received 6 mg of intranasal Narcan..  On waking patient admitted to using heroin.  Denies any other ingestion.  Also had trazodone as well as meth paraphernalia where she was found.  Denies any intent for self-harm.  States she was just trying to get high.  Past Medical History:  Diagnosis Date   Seizures (HCC)    1 seizure at age 25   Family History  Problem Relation Age of Onset   Cancer Maternal Grandmother        breast   Cancer Other        breast; maternal grandma's sister.   Diabetes Mellitus II Paternal Grandmother    Past Surgical History:  Procedure Laterality Date   CESAREAN SECTION     CESAREAN SECTION WITH BILATERAL TUBAL LIGATION Bilateral 03/13/2017   Procedure: CESAREAN SECTION WITH BILATERAL TUBAL LIGATION;  Surgeon: Ward, Elenora Fender, MD;  Location: [facility] ORS;  Service: Obstetrics;  Laterality: Bilateral;   Patient Active Problem List   Diagnosis Date Noted   Facial cellulitis 01/15/2021   Essential hypertension 01/15/2021   Nicotine dependence 01/15/2021   Delivery by elective cesarean section 03/13/2017   Smoking (tobacco) complicating childbirth 03/13/2017   Insufficient prenatal care 03/13/2017   Supervision of high risk pregnancy in third trimester 03/13/2017   H/O gestational diabetes in prior pregnancy, currently pregnant 11/29/2012   Rh negative state in antepartum period 10/26/2012   Previous cesarean section 10/25/2012      Prior to Admission medications   Medication Sig  Start Date End Date Taking? Authorizing Provider  amoxicillin (AMOXIL) 875 MG tablet Take 1 tablet (875 mg total) by mouth 2 (two) times daily. 01/14/21   Chinita Pester, FNP    Allergies Patient has no known allergies.    Social History Social History   Tobacco Use   Smoking status: Every Day    Packs/day: 1.00    Years: 15.00    Pack years: 15.00    Types: Cigarettes   Smokeless tobacco: Never  Vaping Use   Vaping Use: Never used  Substance Use Topics   Alcohol use: No   Drug use: No    Review of Systems Patient denies headaches, rhinorrhea, blurry vision, numbness, shortness of breath, chest pain, edema, cough, abdominal pain, nausea, vomiting, diarrhea, dysuria, fevers, rashes or hallucinations unless otherwise stated above in HPI. ____________________________________________   PHYSICAL EXAM:  VITAL SIGNS: Vitals:   01/26/21 1900 01/26/21 1947  BP: 94/75 96/82  Pulse: 94 76  Resp: 14 14  Temp:    SpO2: 94% 100%    Constitutional: drowsy, but responds to voice and answers questions appropriately Eyes: Conjunctivae are normal. Pupipls 70mm reactive Head: Atraumatic. Nose: No congestion/rhinnorhea. Mouth/Throat: Mucous membranes are moist.   Neck: No stridor. Painless ROM.  Cardiovascular: Normal rate, regular rhythm. Grossly normal heart sounds.  Good peripheral circulation. Respiratory: Normal respiratory effort.  No retractions. Lungs CTAB. Gastrointestinal: Soft and nontender. No distention. No abdominal bruits. No CVA tenderness. Genitourinary: deferred  Musculoskeletal: No lower extremity tenderness nor edema.  No joint effusions. Neurologic:  Normal speech and language. No gross focal neurologic deficits are appreciated. No facial droop Skin:  Skin is warm, dry and intact. No rash noted. Psychiatric: calm and cooperative  ____________________________________________   LABS (all labs ordered are listed, but only abnormal results are  displayed)  Results for orders placed or performed during the hospital encounter of 01/26/21 (from the past 24 hour(s))  CBC     Status: Abnormal   Collection Time: 01/26/21  6:50 PM  Result Value Ref Range   WBC 26.5 (H) 4.0 - 10.5 K/uL   RBC 4.36 3.87 - 5.11 MIL/uL   Hemoglobin 13.7 12.0 - 15.0 g/dL   HCT 19.4 17.4 - 08.1 %   MCV 95.0 80.0 - 100.0 fL   MCH 31.4 26.0 - 34.0 pg   MCHC 33.1 30.0 - 36.0 g/dL   RDW 44.8 18.5 - 63.1 %   Platelets 466 (H) 150 - 400 K/uL   nRBC 0.0 0.0 - 0.2 %  Basic metabolic panel     Status: Abnormal   Collection Time: 01/26/21  6:50 PM  Result Value Ref Range   Sodium 138 135 - 145 mmol/L   Potassium 4.1 3.5 - 5.1 mmol/L   Chloride 107 98 - 111 mmol/L   CO2 22 22 - 32 mmol/L   Glucose, Bld 122 (H) 70 - 99 mg/dL   BUN 26 (H) 6 - 20 mg/dL   Creatinine, Ser 4.97 (H) 0.44 - 1.00 mg/dL   Calcium 8.0 (L) 8.9 - 10.3 mg/dL   GFR, Estimated 41 (L) >60 mL/min   Anion gap 9 5 - 15  hCG, quantitative, pregnancy     Status: None   Collection Time: 01/26/21  6:50 PM  Result Value Ref Range   hCG, Beta Chain, Quant, S <1 <5 mIU/mL  Acetaminophen level     Status: Abnormal   Collection Time: 01/26/21  6:50 PM  Result Value Ref Range   Acetaminophen (Tylenol), Serum <10 (L) 10 - 30 ug/mL   ____________________________________________  EKG My review and personal interpretation at Time: 16:28   Indication: od  Rate: 105  Rhythm: sinus Axis: normal Other: normal intervals, no stemi ____________________________________________  RADIOLOGY  ____________________________________________   PROCEDURES  Procedure(s) performed:  Procedures    Critical Care performed: no ____________________________________________   INITIAL IMPRESSION / ASSESSMENT AND PLAN / ED COURSE  Pertinent labs & imaging results that were available during my care of the patient were reviewed by me and considered in my medical decision making (see chart for details).   DDX:  polysubstance abuse, dysrhythmia, accidental overdose  Gabriela Woodard is a 35 y.o. who presents to the ED with accidental overdose resuscitated with Narcan.  Patient slightly drowsy but moving all extremities following commands appropriately.  Denies any intent for self-harm has a history of polysubstance abuse and recreational use.  Will observe here in the ER we will check basic blood work and reassess.  Clinical Course as of 01/26/21 2025  Sat Jan 26, 2021  0263 Patient asking for something to drink. [PR]  1937 Patient ambulating in the hallway.  Does appear clinically dehydrated and admits to poor p.o. intake.  Does have leukocytosis which she seems to always carry a bit over but no fever.  Suspect likely reactive in the setting of overdose.  Will give IV fluids. [PR]  2021 Patient tolerating p.o.  Received IV fluids.  Is waiting for her  mother to come pick her up.  Appears clinically stable for discharge.  Will be given resources for detox and substance [PR]    Clinical Course User Index [PR] Willy Eddy, MD    The patient was evaluated in Emergency Department today for the symptoms described in the history of present illness. He/she was evaluated in the context of the global COVID-19 pandemic, which necessitated consideration that the patient might be at risk for infection with the SARS-CoV-2 virus that causes COVID-19. Institutional protocols and algorithms that pertain to the evaluation of patients at risk for COVID-19 are in a state of rapid change based on information released by regulatory bodies including the CDC and federal and state organizations. These policies and algorithms were followed during the patient's care in the ED.  As part of my medical decision making, I reviewed the following data within the electronic MEDICAL RECORD NUMBER Nursing notes reviewed and incorporated, Labs reviewed, notes from prior ED visits and [place] Controlled Substance  Database   ____________________________________________   FINAL CLINICAL IMPRESSION(S) / ED DIAGNOSES  Final diagnoses:  Accidental overdose of heroin, initial encounter (HCC)      NEW MEDICATIONS STARTED DURING THIS VISIT:  New Prescriptions   No medications on file     Note:  This document was prepared using Dragon voice recognition software and may include unintentional dictation errors.    Willy Eddy, MD 01/26/21 2025

## 2021-01-26 NOTE — ED Notes (Signed)
Pt given a mango and strawberry icy.

## 2022-01-13 ENCOUNTER — Emergency Department
Admission: EM | Admit: 2022-01-13 | Discharge: 2022-01-13 | Disposition: A | Discharge status: Home / Self Care | Payer: Medicaid Other | Attending: Emergency Medicine | Admitting: Emergency Medicine

## 2022-01-13 ENCOUNTER — Other Ambulatory Visit: Payer: Self-pay

## 2022-01-13 ENCOUNTER — Encounter: Payer: Self-pay | Admitting: Emergency Medicine

## 2022-01-13 DIAGNOSIS — I10 Essential (primary) hypertension: Secondary | ICD-10-CM | POA: Diagnosis not present

## 2022-01-13 DIAGNOSIS — K0889 Other specified disorders of teeth and supporting structures: Secondary | ICD-10-CM | POA: Insufficient documentation

## 2022-01-13 MED ORDER — LIDOCAINE-EPINEPHRINE 2 %-1:100000 IJ SOLN
1.7000 mL | Freq: Once | INTRAMUSCULAR | Status: AC
  Administered 2022-01-13: 1.7 mL
  Filled 2022-01-13: qty 1.7

## 2022-01-13 MED ORDER — NAPROXEN 500 MG PO TABS: 500.0000 mg | ORAL_TABLET | Freq: Two times a day (BID) | ORAL | 0 refills | Status: AC

## 2022-01-13 MED ORDER — AMOXICILLIN-POT CLAVULANATE 875-125 MG PO TABS: 1.0000 | ORAL_TABLET | Freq: Two times a day (BID) | ORAL | 0 refills | Status: AC

## 2022-01-13 NOTE — ED Provider Notes (Signed)
[facility] Provider Note    Event Date/Time   First MD Initiated Contact with Patient 01/13/22 1508     (approximate)   History   Chief Complaint Dental Pain   HPI Gabriela Woodard is a 36 y.o. female, history of hypertension, seizures, presents to the emergency department for evaluation of lower right dental pain.  Reports increased pain and swelling along the right jaw for the past 3 days.  She states that she has poor dentition and is scheduled to have teeth removed within the next few weeks.  Reports feeling feverish at night.  Denies dysphagia, sore throat, cough, congestion, ear pain, chest pain, shortness of breath, abdominal pain, or dizziness/lightheadedness.  History Limitations: No limitations.        Physical Exam  Triage Vital Signs: ED Triage Vitals  Enc Vitals Group     BP 01/13/22 1303 (!) 160/116     Pulse Rate 01/13/22 1303 76     Resp 01/13/22 1303 18     Temp 01/13/22 1301 99.1 F (37.3 C)     Temp Source 01/13/22 1301 Oral     SpO2 01/13/22 1303 99 %     Weight 01/13/22 1301 152 lb (68.9 kg)     Height 01/13/22 1301 5\' 2"  (1.575 m)     Head Circumference --      Peak Flow --      Pain Score 01/13/22 1301 9     Pain Loc --      Pain Edu? --      Excl. in GC? --     Most recent vital signs: Vitals:   01/13/22 1301 01/13/22 1303  BP:  (!) 160/116  Pulse:  76  Resp:  18  Temp: 99.1 F (37.3 C)   SpO2:  99%    General: Awake, NAD.  Skin: Warm, dry. No rashes or lesions.  Eyes: PERRL. Conjunctivae normal.  CV: Good peripheral perfusion.  Resp: Normal effort.  Abd: Soft, non-tender. No distention.  Neuro: At baseline. No gross neurological deficits.   Focused Exam: Poor dentition noted diffusely.  Multiple dental caries.  Mild erythema present along the lower right gumline, no obvious abscesses.  No active bleeding or discharge.  Mild lymphadenopathy present in the submandibular region on the right side.  Physical  Exam    ED Results / Procedures / Treatments  Labs (all labs ordered are listed, but only abnormal results are displayed) Labs Reviewed - No data to display   EKG N/A.   RADIOLOGY  ED Provider Interpretation: N/A.  No results found.  PROCEDURES:  Critical Care performed: N/A.  Dental Block  Date/Time: 01/14/2022 12:36 AM  Performed by: 01/16/2022, PA Authorized by: Varney Daily, PA   Consent:    Consent obtained:  Verbal   Consent given by:  Patient   Risks, benefits, and alternatives were discussed: yes     Risks discussed:  Infection, nerve damage, swelling, unsuccessful block, intravascular injection, pain and hematoma   Alternatives discussed:  No treatment Universal protocol:    Patient identity confirmed:  Verbally with patient Indications:    Indications: dental pain   Location:    Block type:  Inferior alveolar   Laterality:  Right Procedure details:    Syringe type:  Controlled syringe   Needle gauge:  27 G   Anesthetic injected:  Lidocaine 2% WITH epi   Injection procedure:  Anatomic landmarks identified Post-procedure details:    Outcome:  Anesthesia achieved   Procedure completion:  Tolerated well, no immediate complications     MEDICATIONS ORDERED IN ED: Medications  lidocaine-EPINEPHrine (XYLOCAINE W/EPI) 2 %-1:100000 (with pres) injection 1.7 mL (1.7 mLs Infiltration Given by Other 01/13/22 1534)  lidocaine-EPINEPHrine (XYLOCAINE W/EPI) 2 %-1:100000 (with pres) injection 1.7 mL (1.7 mLs Infiltration Given by Other 01/13/22 1558)     IMPRESSION / MDM / ASSESSMENT AND PLAN / ED COURSE  I reviewed the triage vital signs and the nursing notes.                              Differential diagnosis includes, but is not limited to, dental caries, gingivitis, dental abscess, periodontal infection, pulpitis  Assessment/Plan Patient presents with persistent dental pain x3 days, likely pulpitis associated with dental infection.   No abscesses present.  Provided patient with a inferior alveolar block for analgesia.  We will additionally provide her with a prescription for Augmentin and naproxen.  Encouraged her to follow-up with her dental provider within the next few weeks.  Will discharge  Patient's presentation is most consistent with acute, uncomplicated illness.   Provided the patient with anticipatory guidance, return precautions, and educational material. Encouraged the patient to return to the emergency department at any time if they begin to experience any new or worsening symptoms. Patient expressed understanding and agreed with the plan.       FINAL CLINICAL IMPRESSION(S) / ED DIAGNOSES   Final diagnoses:  Pain, dental     Rx / DC Orders   ED Discharge Orders          Ordered    amoxicillin-clavulanate (AUGMENTIN) 875-125 MG tablet  2 times daily        01/13/22 1550    naproxen (NAPROSYN) 500 MG tablet  2 times daily with meals        01/13/22 1550             Note:  This document was prepared using Dragon voice recognition software and may include unintentional dictation errors.   Varney Daily, [place] 01/14/22 Atlee Abide    Minna Antis, MD 01/15/22 [phone]

## 2022-01-13 NOTE — ED Triage Notes (Signed)
Pt in with co toothache and right jaw pain and swelling for few days.

## 2022-01-13 NOTE — Discharge Instructions (Addendum)
-  Take all of the amoxicillin/clavulanic as prescribed.  -You may additionally take Tylenol and naproxen as needed for pain.  -Please follow-up with your dentist soon, as discussed.  -Return to the emergency department anytime if you begin to experience any new or worsening symptoms.

## 2022-06-05 ENCOUNTER — Ambulatory Visit
Admission: EM | Admit: 2022-06-05 | Discharge: 2022-06-05 | Disposition: A | Discharge status: Home / Self Care | Payer: Medicaid Other

## 2022-06-05 DIAGNOSIS — K0889 Other specified disorders of teeth and supporting structures: Secondary | ICD-10-CM | POA: Diagnosis not present

## 2022-06-05 MED ORDER — AMOXICILLIN-POT CLAVULANATE 875-125 MG PO TABS: 1.0000 | ORAL_TABLET | Freq: Two times a day (BID) | ORAL | 0 refills | Status: DC

## 2022-06-05 NOTE — ED Triage Notes (Signed)
Pt c/o possible ear infection and right lower dental pain x1week  Pt states that she has a dental appointment on Monday.  Pt states that she was a drug addict for 15 years and has really bad teeth. Pt states that she has been clean for 1 year

## 2022-06-05 NOTE — ED Provider Notes (Signed)
[facility]    CSN: [id number] Arrival date & time: 06/05/22  1504      History   Chief Complaint Chief Complaint  Patient presents with   Dental Pain    HPI Gabriela Woodard is a 36 y.o. female.   Patient presents with right lower dental pain present for 7 days.  Pain radiating to the right ear.  Endorses upcoming dental appointment on Monday at 9:30 AM for evaluation for removal as there is significant decay due to history of drug abuse, endorses last use of drugs 1 year ago.  Denies fever, drainage.  Past Medical History:  Diagnosis Date   Seizures (HCC)    1 seizure at age 61    Patient Active Problem List   Diagnosis Date Noted   Facial cellulitis 01/15/2021   Essential hypertension 01/15/2021   Nicotine dependence 01/15/2021   Delivery by elective cesarean section 03/13/2017   Smoking (tobacco) complicating childbirth 03/13/2017   Insufficient prenatal care 03/13/2017   Supervision of high risk pregnancy in third trimester 03/13/2017   H/O gestational diabetes in prior pregnancy, currently pregnant 11/29/2012   Rh negative state in antepartum period 10/26/2012   Previous cesarean section 10/25/2012    Past Surgical History:  Procedure Laterality Date   CESAREAN SECTION     CESAREAN SECTION WITH BILATERAL TUBAL LIGATION Bilateral 03/13/2017   Procedure: CESAREAN SECTION WITH BILATERAL TUBAL LIGATION;  Surgeon: Ward, Elenora Fender, MD;  Location: [facility] ORS;  Service: Obstetrics;  Laterality: Bilateral;    OB History     Gravida  4   Para  3   Term  3   Preterm      AB      Living  3      SAB      IAB      Ectopic      Multiple  0   Live Births  3            Home Medications    Prior to Admission medications   Medication Sig Start Date End Date Taking? Authorizing Provider  amoxicillin-clavulanate (AUGMENTIN) 875-125 MG tablet Take 1 tablet by mouth every 12 (twelve) hours. 06/05/22  Yes Chaye Misch R, NP  buprenorphine  (SUBUTEX) 8 MG SUBL SL tablet Place under the tongue. 05/16/22  Yes [provider]    Family History Family History  Problem Relation Age of Onset   Cancer Maternal Grandmother        breast   Cancer Other        breast; maternal grandma's sister.   Diabetes Mellitus II Paternal Grandmother     Social History Social History   Tobacco Use   Smoking status: Every Day    Packs/day: 1.00    Years: 15.00    Total pack years: 15.00    Types: Cigarettes   Smokeless tobacco: Never  Vaping Use   Vaping Use: Never used  Substance Use Topics   Alcohol use: No   Drug use: No     Allergies   Patient has no known allergies.   Review of Systems Review of Systems   Physical Exam Triage Vital Signs ED Triage Vitals  Enc Vitals Group     BP 06/05/22 1527 128/67     Pulse Rate 06/05/22 1527 70     Resp 06/05/22 1527 12     Temp 06/05/22 1527 98.1 F (36.7 C)     Temp Source 06/05/22 1527 Oral  SpO2 06/05/22 1527 99 %     Weight 06/05/22 1525 154 lb (69.9 kg)     Height 06/05/22 1525 5\' 1"  (1.549 m)     Head Circumference --      Peak Flow --      Pain Score 06/05/22 1525 7     Pain Loc --      Pain Edu? --      Excl. in GC? --    No data found.  Updated Vital Signs BP 128/67 (BP Location: Left Arm)   Pulse 70   Temp 98.1 F (36.7 C) (Oral)   Resp 12   Ht 5\' 1"  (1.549 m)   Wt 154 lb (69.9 kg)   LMP 05/08/2022   SpO2 99%   BMI 29.10 kg/m   Visual Acuity Right Eye Distance:   Left Eye Distance:   Bilateral Distance:    Right Eye Near:   Left Eye Near:    Bilateral Near:     Physical Exam Constitutional:      Appearance: Normal appearance.  HENT:     Mouth/Throat:     Comments: Significant dental decay throughout mouth, mild to moderate gingival swelling along the right lower gumline, no abscess noted, pharynx is clear without obstruction Eyes:     Extraocular Movements: Extraocular movements intact.  Pulmonary:     Effort: Pulmonary  effort is normal.  Neurological:     Mental Status: She is alert and oriented to person, place, and time. Mental status is at baseline.      UC Treatments / Results  Labs (all labs ordered are listed, but only abnormal results are displayed) Labs Reviewed - No data to display  EKG   Radiology No results found.  Procedures Procedures (including critical care time)  Medications Ordered in UC Medications - No data to display  Initial Impression / Assessment and Plan / UC Course  I have reviewed the triage vital signs and the nursing notes.  Pertinent labs & imaging results that were available during my care of the patient were reviewed by me and considered in my medical decision making (see chart for details).    Dental pain  Will provide coverage for infection, Augmentin prescribed, recommended over-the-counter analgesics, good hygiene, soft foods for additional supportive measures, strongly advised keeping appointment on Monday with dentist for further evaluation and management, may follow-up with his urgent care as needed Final Clinical Impressions(s) / UC Diagnoses   Final diagnoses:  Pain, dental     Discharge Instructions      These are most likely infected, please keep upcoming dental appointment this Monday for further evaluation by the specialist  Begin Augmentin every morning and every evening for 7 days to help clear infection, will start to see improvement in about 2 days  You may attempt use of salt water gargles, Listerine gargles, Orajel, throat spray as needed for additional comfort  May take Tylenol 500 to 1000 mg every 6 hours and/or ibuprofen 600 to 800 mg every 6-8 hours for management of pain   may follow-up with his urgent care as needed   ED Prescriptions     Medication Sig Dispense Auth. Provider   amoxicillin-clavulanate (AUGMENTIN) 875-125 MG tablet Take 1 tablet by mouth every 12 (twelve) hours. 14 tablet Devon Kingdon, Tuesday, NP       PDMP not reviewed this encounter.   Monday, NP 06/05/22 1558

## 2022-06-05 NOTE — Discharge Instructions (Signed)
These are most likely infected, please keep upcoming dental appointment this Monday for further evaluation by the specialist  Begin Augmentin every morning and every evening for 7 days to help clear infection, will start to see improvement in about 2 days  You may attempt use of salt water gargles, Listerine gargles, Orajel, throat spray as needed for additional comfort  May take Tylenol 500 to 1000 mg every 6 hours and/or ibuprofen 600 to 800 mg every 6-8 hours for management of pain   may follow-up with his urgent care as needed

## 2022-08-23 ENCOUNTER — Other Ambulatory Visit: Payer: Self-pay

## 2022-08-23 ENCOUNTER — Emergency Department
Admission: EM | Admit: 2022-08-23 | Discharge: 2022-08-23 | Disposition: A | Discharge status: Home / Self Care | Payer: Medicaid Other | Attending: Emergency Medicine | Admitting: Emergency Medicine

## 2022-08-23 DIAGNOSIS — K047 Periapical abscess without sinus: Secondary | ICD-10-CM

## 2022-08-23 DIAGNOSIS — K0889 Other specified disorders of teeth and supporting structures: Secondary | ICD-10-CM | POA: Diagnosis present

## 2022-08-23 DIAGNOSIS — K029 Dental caries, unspecified: Secondary | ICD-10-CM

## 2022-08-23 MED ORDER — AMOXICILLIN-POT CLAVULANATE 875-125 MG PO TABS: 1.0000 | ORAL_TABLET | Freq: Two times a day (BID) | ORAL | 0 refills | Status: DC

## 2022-08-23 NOTE — ED Provider Notes (Signed)
[facility] Provider Note    Event Date/Time   First MD Initiated Contact with Patient 08/23/22 1018     (approximate)   History   Dental Pain   HPI  Gabriela Woodard is a 37 y.o. female   presents to the ED with complaint of dental pain for several days.  Patient states that she has several bad teeth and has not been able to find a dentist.      Physical Exam   Triage Vital Signs: ED Triage Vitals [08/23/22 0951]  Enc Vitals Group     BP (!) 122/91     Pulse Rate 87     Resp 18     Temp 98 F (36.7 C)     Temp Source Oral     SpO2 98 %     Weight 154 lb 1.6 oz (69.9 kg)     Height '5\' 1"'$  (1.549 m)     Head Circumference      Peak Flow      Pain Score 8     Pain Loc      Pain Edu?      Excl. in [place]?     Most recent vital signs: Vitals:   08/23/22 0951  BP: (!) 122/91  Pulse: 87  Resp: 18  Temp: 98 F (36.7 C)  SpO2: 98%     General: Awake, no distress.  CV:  Good peripheral perfusion.  Resp:  Normal effort.  Abd:  No distention.  Other:  Right upper gums are edematous with multiple dental caries and decay below the gumline.  No active drainage present.   ED Results / Procedures / Treatments   Labs (all labs ordered are listed, but only abnormal results are displayed) Labs Reviewed - No data to display    PROCEDURES:  Critical Care performed:   Procedures   MEDICATIONS ORDERED IN ED: Medications - No data to display   IMPRESSION / MDM / [place] / ED COURSE  I reviewed the triage vital signs and the nursing notes.   Differential diagnosis includes, but is not limited to, dental abscess, gingivitis, need for dental care.  37 year old female presents to the ED with multiple dental caries and pain.  She states that she has not been able to see a dentist.  After exam patient was made aware that she will need to see an oral surgeon as there is very little tooth material above the gumline.  A prescription  for Augmentin 875 twice daily for 10 days was sent to the pharmacy.  Patient was given a list of dental clinics in the area and strongly encouraged to call [facility] as she will be needing an oral surgeon rather than a general dentist.      Patient's presentation is most consistent with acute complicated illness / injury requiring diagnostic workup.  FINAL CLINICAL IMPRESSION(S) / ED DIAGNOSES   Final diagnoses:  Dental abscess  Dental caries     Rx / DC Orders   ED Discharge Orders          Ordered    amoxicillin-clavulanate (AUGMENTIN) 875-125 MG tablet  2 times daily        08/23/22 1048             Note:  This document was prepared using Dragon voice recognition software and may include unintentional dictation errors.   Johnn Hai, PA-C 08/23/22 1209    Nathaniel Man, MD 08/24/22 [phone]

## 2022-08-23 NOTE — ED Triage Notes (Signed)
Pt here with right upper dental pain for a while. Pt unable to find a dentist at this time. Pt stable in triage.

## 2022-08-23 NOTE — Discharge Instructions (Addendum)
Begin taking all the antibiotics until completely finished.  You may take Tylenol or ibuprofen as needed for pain and inflammation. A list of dental clinics is listed on your discharge papers.  Consider calling [facility] dental clinic as it appears that you are going to need an oral surgeon.    OPTIONS FOR DENTAL FOLLOW UP CARE  [place] [facility] [place] [email or web address] [phone])  [facility] [phone])  [place] [phone])  [place] [phone])  [facility] [phone]) This clinic caters to the indigent population and is on a lottery system. Location: [employer] [facility], [place], [place], [place] Clinic Hours: Wednesdays from 6pm - 9pm, patients seen by a lottery system. For dates, call or go to [email or web address] Services: Cleanings, fillings and simple extractions. Payment Options: DENTAL WORK IS FREE OF CHARGE. Bring proof of income or support. Best way to get seen: Arrive at 5:15 pm - this is a lottery, NOT first come/first serve, so arriving earlier will not increase your chances of being seen.     [facility] [phone] Select option 1 for emergencies   Location: [facility], [place], [address] [place] Clinic Hours: No walk-ins accepted - call the day before to schedule an appointment. Check in times are 9:30 am and 1:30 pm. Services: Simple extractions, temporary fillings, pulpectomy/pulp debridement, uncomplicated abscess drainage. Payment Options: PAYMENT IS DUE AT THE TIME OF SERVICE.  Fee is usually $100-200, additional surgical procedures (e.g. abscess drainage) may be extra. Cash, checks, Visa/MasterCard accepted.  Can file Medicaid if patient is covered  for dental - patient should call case worker to check. No discount for [facility] patients. Best way to get seen: MUST call the day before and get onto the schedule. Can usually be seen the next 1-2 days. No walk-ins accepted.     [place] [phone]   Location: [place], [place] Clinic Hours: M, W, Th, F 8am or 1:30pm, Tues 9a or 1:30 - first come/first served. Services: Simple extractions, temporary fillings, uncomplicated abscess drainage.  You do not need to be an [facility] resident. Payment Options: PAYMENT IS DUE AT THE TIME OF SERVICE. Dental insurance, otherwise sliding scale - bring proof of income or support. Depending on income and treatment needed, cost is usually $50-200. Best way to get seen: Arrive early as it is first come/first served.     [facility] [phone]   Location: [place] [facility] Hours: Mon-Thu 8a-5p Services: Most basic dental services including extractions and fillings. Payment Options: PAYMENT IS DUE AT THE TIME OF SERVICE. Sliding scale, up to 50% off - bring proof if income or support. Medicaid with dental option accepted. Best way to get seen: Call to schedule an appointment, can usually be seen within 2 weeks OR they will try to see walk-ins - show up at [place] or 2p (you may have to wait).     [facility] RESIDENTS ONLY   Location: [facility] [place] [address], [place], [place] [address] Clinic Hours: By appointment only. Monday - Thursday 8am-5pm, Friday 8am-12pm Services: Cleanings, fillings, extractions. Payment Options: PAYMENT IS DUE AT THE TIME OF SERVICE. Cash, Visa or MasterCard. Sliding scale - $30 minimum per service. Best way to get  seen: Come in to office, complete packet and make an appointment - need proof of income or support monies for  each household member and proof of [facility] residence. Usually takes about a month to get in.     [facility] [phone]   Location: [address] [place] Clinic Hours: Walk-in Urgent Care Dental Services are offered Monday-Friday mornings only. The numbers of emergencies accepted daily is limited to the number of providers available. Maximum 15 - Mondays, Wednesdays & Thursdays Maximum 10 - Tuesdays & Fridays Services: You do not need to be a [facility] resident to be seen for a dental emergency. Emergencies are defined as pain, swelling, abnormal bleeding, or dental trauma. Walkins will receive x-rays if needed. NOTE: Dental cleaning is not an emergency. Payment Options: PAYMENT IS DUE AT THE TIME OF SERVICE. Minimum co-pay is $40.00 for uninsured patients. Minimum co-pay is $3.00 for Medicaid with dental coverage. Dental Insurance is accepted and must be presented at time of visit. Medicare does not cover dental. Forms of payment: Cash, credit card, checks. Best way to get seen: If not previously registered with the clinic, walk-in dental registration begins at 7:15 am and is on a first come/first serve basis. If previously registered with the clinic, call to make an appointment.     [facility] [place] ONLY   Location: [address] [place], [place] Clinic Hours: Mon-Thu 10a-2p Services: Extractions only! Payment Options: FREE (donations accepted) - bring proof of income or support Best way to get seen: Call and schedule an appointment OR come at 8am on the 1st Monday of every month (except for holidays) when it is first come/first served.     [facility] [phone]   Location: [place], [place] Clinic Hours: Friday mornings Services, Payment Options, Best way to get seen: Call for info

## 2023-11-02 ENCOUNTER — Ambulatory Visit
Admission: EM | Admit: 2023-11-02 | Discharge: 2023-11-02 | Disposition: A | Discharge status: Home / Self Care | Payer: MEDICAID | Attending: Family Medicine | Admitting: Family Medicine

## 2023-11-02 DIAGNOSIS — K047 Periapical abscess without sinus: Secondary | ICD-10-CM

## 2023-11-02 DIAGNOSIS — Z599 Problem related to housing and economic circumstances, unspecified: Secondary | ICD-10-CM

## 2023-11-02 DIAGNOSIS — K029 Dental caries, unspecified: Secondary | ICD-10-CM

## 2023-11-02 MED ORDER — AMOXICILLIN-POT CLAVULANATE 875-125 MG PO TABS: 1.0000 | ORAL_TABLET | Freq: Two times a day (BID) | ORAL | 0 refills | Status: AC

## 2023-11-02 NOTE — Discharge Instructions (Signed)
 See dental resource guide   At this time there is no abscess to be drained. You were prescribed an antibiotic. Please take this exactly as directed and do not stop taking it until the entire course of medicine is finished, even if you begin to feel better before finishing the course.   Schedule an appointment with your dentist or call local dentists to see if they take your insurance or can do a payment payment plan.  [facility] [employer] [facility] are options. Consider [facility] prior to your dental appointment.   Consider the [facility] dental school  free clinic operates from 6-9 pm on select Wednesdays:  [facility] [employer], [place] [address] [place], [place] [address] Parking Location: [place]

## 2023-11-02 NOTE — ED Triage Notes (Signed)
 Patient presents to [facility] for right lower dental abscess noted yesterday. Treating pain with BC.

## 2023-11-02 NOTE — ED Provider Notes (Signed)
 [facility]    CSN: [id number] Arrival date & time: 11/02/23  1738      History   Chief Complaint Chief Complaint  Patient presents with   Dental Pain    HPI Gabriela Woodard is a 38 y.o. female.   HPI  Gabriela Woodard presents for lower right sided dental pain that started yesterday.  She has a Education officer, community but has not been able to see them.  She has had pain in this area before. There is a tooth that needs to get pulled. She has not been able to get into a dentist.  She took BC and buprenorphine daily.  Has some swelling of her jaw.  She left work to come to the urgent care as her pain was so bad.    Past Medical History:  Diagnosis Date   Seizures (HCC)    1 seizure at age 71    Patient Active Problem List   Diagnosis Date Noted   Facial cellulitis 01/15/2021   Essential hypertension 01/15/2021   Nicotine  dependence 01/15/2021   Delivery by elective cesarean section 03/13/2017   Smoking (tobacco) complicating childbirth 03/13/2017   Insufficient prenatal care 03/13/2017   Supervision of high risk pregnancy in third trimester 03/13/2017   H/O gestational diabetes in prior pregnancy, currently pregnant 11/29/2012   Rh negative state in antepartum period 10/26/2012   Previous cesarean section 10/25/2012    Past Surgical History:  Procedure Laterality Date   CESAREAN SECTION     CESAREAN SECTION WITH BILATERAL TUBAL LIGATION Bilateral 03/13/2017   Procedure: CESAREAN SECTION WITH BILATERAL TUBAL LIGATION;  Surgeon: Woodard, Gabriela Shay, MD;  Location: [facility] OR[facility];  Service: Obstetrics;  Laterality: Bilateral;    OB History     Gravida  4   Para  3   Term  3   Preterm      AB      Living  3      SAB      IAB      Ectopic      Multiple  0   Live Births  3            Home Medications    Prior to Admission medications   Medication Sig Start Date End Date Taking? Authorizing Provider  amoxicillin -clavulanate (AUGMENTIN ) 875-125 MG tablet Take 1  tablet by mouth 2 (two) times daily. 11/02/23   Gabriela Speiser, DO  buprenorphine (SUBUTEX) 8 MG SUBL SL tablet Place under the tongue. 05/16/22   [provider]    Family History Family History  Problem Relation Age of Onset   Cancer Maternal Grandmother        breast   Cancer Other        breast; maternal grandma's sister.   Diabetes Mellitus II Paternal Grandmother     Social History Social History   Tobacco Use   Smoking status: Every Day    Current packs/day: 1.00    Average packs/day: 1 pack/day for 15.0 years (15.0 ttl pk-yrs)    Types: Cigarettes   Smokeless tobacco: Never  Vaping Use   Vaping status: Never Used  Substance Use Topics   Alcohol use: No   Drug use: No     Allergies   Patient has no known allergies.   Review of Systems Review of Systems : negative unless otherwise stated in HPI.      Physical Exam Triage Vital Signs ED Triage Vitals  Encounter Vitals Group     BP 11/02/23  1838 (!) 141/91     Systolic BP Percentile --      Diastolic BP Percentile --      Pulse Rate 11/02/23 1838 95     Resp 11/02/23 1838 18     Temp 11/02/23 1838 99.2 F (37.3 C)     Temp Source 11/02/23 1838 Oral     SpO2 11/02/23 1838 97 %     Weight --      Height --      Head Circumference --      Peak Flow --      Pain Score 11/02/23 1837 10     Pain Loc --      Pain Education --      Exclude from Growth Chart --    No data found.  Updated Vital Signs BP (!) 141/91 (BP Location: Left Arm)   Pulse 95   Temp 99.2 F (37.3 C) (Oral)   Resp 18   LMP 10/09/2023 (Approximate)   SpO2 97%   Breastfeeding No   Visual Acuity Right Eye Distance:   Left Eye Distance:   Bilateral Distance:    Right Eye Near:   Left Eye Near:    Bilateral Near:     Physical Exam  GEN:     alert, uncomfortable appearing female in no distress    HENT:  mucus membranes moist, oropharyngeal without lesions exudates or erythema, no nasal discharge, posterior right  molars tender to percussion, several missing teeth, no trismus, no secretion pooling, no palpable induration and no visible swelling of the floor the mouth, decreased jaw movement 2/2 to pain  EYES:   no scleral injection or discharge NECK:  normal ROM, no lymphadenopathy, no meningismus   RESP:  no increased work of breathing CVS:   regular rate  Skin:   warm and dry, no rash or skin changes of on external jaw    UC Treatments / Results  Labs (all labs ordered are listed, but only abnormal results are displayed) Labs Reviewed - No data to display  EKG   Radiology No results found.  Procedures Procedures (including critical care time)  Medications Ordered in UC Medications - No data to display  Initial Impression / Assessment and Plan / UC Course  I have reviewed the triage vital signs and the nursing notes.  Pertinent labs & imaging results that were available during my care of the patient were reviewed by me and considered in my medical decision making (see chart for details).     Pt is a 38 y.o. female who presents for 1 day of dental pain. Gabriela Woodard is afebrile here without recent antipyretics. Satting well on room air. Overall pt is uncomfortable appearing, well hydrated, without respiratory distress.  Dental exam concerning for dental abscess and dental caries. Prescribed antibiotics as  below. Given dental resource handout for lower cost dental care.  - continue Tylenol  with Motrin  as needed for discomfort - Gargle with salt water several times a day - Establish care with a dentist.  - Discussed  ED precautions, understanding voiced.   Discussed MDM, treatment plan and plan for follow-up with patient/parent who agrees with plan.   Final Clinical Impressions(s) / UC Diagnoses   Final diagnoses:  Pain due to dental caries  Dental abscess  Financial difficulties     Discharge Instructions      See dental resource guide   At this time there is no abscess to be  drained. You were prescribed an antibiotic.  Please take this exactly as directed and do not stop taking it until the entire course of medicine is finished, even if you begin to feel better before finishing the course.   Schedule an appointment with your dentist or call local dentists to see if they take your insurance or can do a payment payment plan.  [facility] [employer] [facility] are options. Consider [facility] prior to your dental appointment.   Consider the [facility] dental school  free clinic operates from 6-9 pm on select Wednesdays:  [facility] Ground Floor, [place] [address] [place], [place] [address] Parking Location: [place]        ED Prescriptions     Medication Sig Dispense Auth. Provider   amoxicillin -clavulanate (AUGMENTIN ) 875-125 MG tablet Take 1 tablet by mouth 2 (two) times daily. 20 tablet Ramesha Poster, DO      PDMP not reviewed this encounter.   Fidel Huddle, DO 11/02/23 1906
